# Patient Record
Sex: Female | Born: 1975 | Race: Black or African American | Hispanic: No | Marital: Single | Smoking: Never smoker
Health system: Southern US, Community
[De-identification: ages and names within clinical notes are randomized; demographics above are authoritative.]

## PROBLEM LIST (undated history)

## (undated) DIAGNOSIS — E119 Type 2 diabetes mellitus without complications: Secondary | ICD-10-CM

## (undated) DIAGNOSIS — M549 Dorsalgia, unspecified: Secondary | ICD-10-CM

## (undated) DIAGNOSIS — I10 Essential (primary) hypertension: Secondary | ICD-10-CM

## (undated) DIAGNOSIS — R29898 Other symptoms and signs involving the musculoskeletal system: Secondary | ICD-10-CM

## (undated) DIAGNOSIS — E669 Obesity, unspecified: Secondary | ICD-10-CM

## (undated) DIAGNOSIS — M79604 Pain in right leg: Secondary | ICD-10-CM

## (undated) HISTORY — DX: Dorsalgia, unspecified: M54.9

## (undated) HISTORY — DX: Pain in right leg: M79.604

## (undated) HISTORY — PX: FOOT SURGERY: SHX648

## (undated) HISTORY — DX: Type 2 diabetes mellitus without complications: E11.9

## (undated) HISTORY — DX: Essential (primary) hypertension: I10

## (undated) HISTORY — DX: Other symptoms and signs involving the musculoskeletal system: R29.898

## (undated) HISTORY — PX: CHOLECYSTECTOMY: SHX55

## (undated) HISTORY — DX: Obesity, unspecified: E66.9

---

## 2014-06-10 DIAGNOSIS — I10 Essential (primary) hypertension: Secondary | ICD-10-CM | POA: Insufficient documentation

## 2014-06-10 DIAGNOSIS — M533 Sacrococcygeal disorders, not elsewhere classified: Secondary | ICD-10-CM | POA: Insufficient documentation

## 2015-02-09 DIAGNOSIS — R208 Other disturbances of skin sensation: Secondary | ICD-10-CM | POA: Insufficient documentation

## 2015-02-09 DIAGNOSIS — M5431 Sciatica, right side: Secondary | ICD-10-CM | POA: Insufficient documentation

## 2015-02-09 DIAGNOSIS — M21372 Foot drop, left foot: Secondary | ICD-10-CM | POA: Insufficient documentation

## 2016-06-19 DIAGNOSIS — H52223 Regular astigmatism, bilateral: Secondary | ICD-10-CM | POA: Insufficient documentation

## 2016-06-19 DIAGNOSIS — E1165 Type 2 diabetes mellitus with hyperglycemia: Secondary | ICD-10-CM | POA: Insufficient documentation

## 2016-06-26 DIAGNOSIS — F320 Major depressive disorder, single episode, mild: Secondary | ICD-10-CM | POA: Insufficient documentation

## 2016-06-26 DIAGNOSIS — M21611 Bunion of right foot: Secondary | ICD-10-CM | POA: Insufficient documentation

## 2016-07-04 DIAGNOSIS — M2012 Hallux valgus (acquired), left foot: Secondary | ICD-10-CM | POA: Insufficient documentation

## 2016-07-04 DIAGNOSIS — M2140 Flat foot [pes planus] (acquired), unspecified foot: Secondary | ICD-10-CM | POA: Insufficient documentation

## 2018-03-22 DIAGNOSIS — Z794 Long term (current) use of insulin: Secondary | ICD-10-CM | POA: Insufficient documentation

## 2018-03-22 DIAGNOSIS — Z7984 Long term (current) use of oral hypoglycemic drugs: Secondary | ICD-10-CM | POA: Insufficient documentation

## 2018-03-22 DIAGNOSIS — H16223 Keratoconjunctivitis sicca, not specified as Sjogren's, bilateral: Secondary | ICD-10-CM | POA: Insufficient documentation

## 2018-11-29 ENCOUNTER — Other Ambulatory Visit: Payer: Self-pay | Admitting: Family Medicine

## 2018-11-29 DIAGNOSIS — R29898 Other symptoms and signs involving the musculoskeletal system: Secondary | ICD-10-CM

## 2018-12-15 ENCOUNTER — Other Ambulatory Visit: Payer: Self-pay

## 2018-12-15 ENCOUNTER — Ambulatory Visit
Admission: RE | Admit: 2018-12-15 | Discharge: 2018-12-15 | Disposition: A | Discharge status: Home / Self Care | Payer: 59 | Source: Ambulatory Visit | Attending: Family Medicine | Admitting: Family Medicine

## 2018-12-15 DIAGNOSIS — R29898 Other symptoms and signs involving the musculoskeletal system: Secondary | ICD-10-CM

## 2018-12-15 IMAGING — MR MR LUMBAR SPINE W/O CM
4 of 5 series · 18 of 48 positions shown · non-contrast
Comparison: Lumbar spine radiographs [DATE]

CLINICAL DATA: Fall 8 years ago. Left lower extremity weakness.
Pain and posterior right leg. Progressive symptoms since [REDACTED].
Initial encounter.

EXAM:
MRI LUMBAR SPINE WITHOUT CONTRAST
TECHNIQUE: Multiplanar, multisequence MR imaging of the lumbar spine was
performed. No intravenous contrast was administered.

[Series 5: T2 · sagittal · 4.0mm · 0.73mm/px · 6 of 15 slices shown (1 of 2)]
[im 1/15]
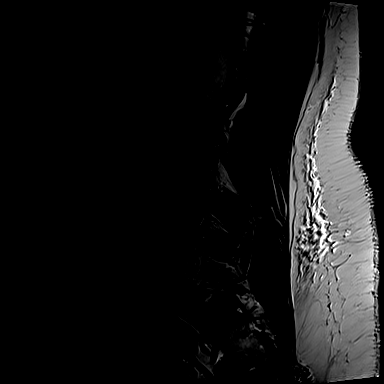
[im 3/15]
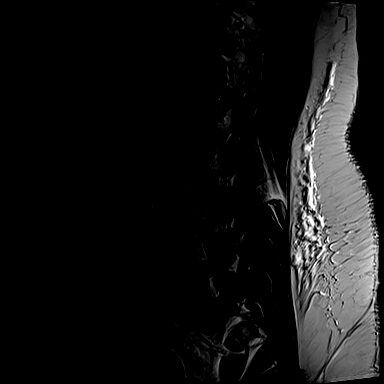
[im 6/15]
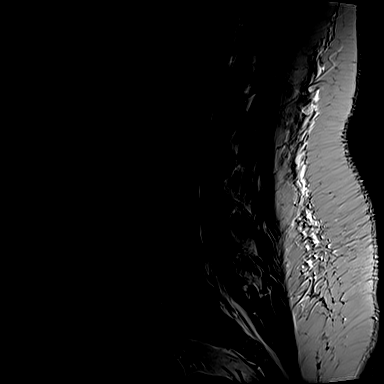
[im 9/15]
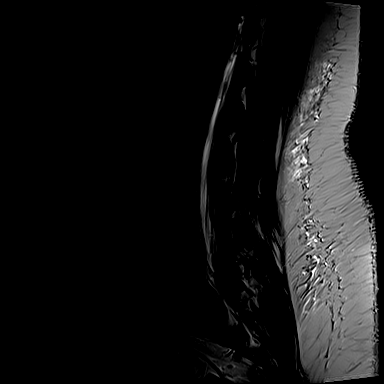
[im 12/15]
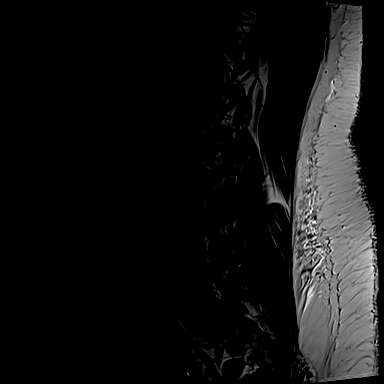
[im 15/15]
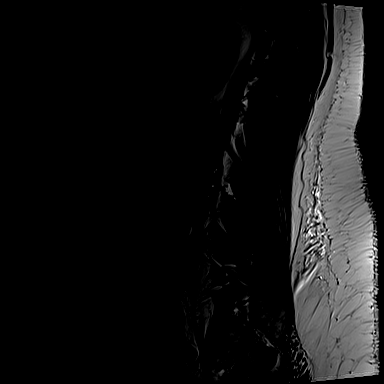

[Series 6: T1 · sagittal · 4.0mm · 0.88mm/px · 3 of 15 slices shown (1 of 2)]
[im 1/15]
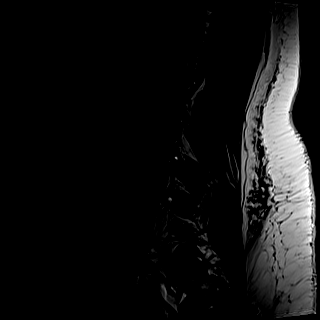
[im 8/15]
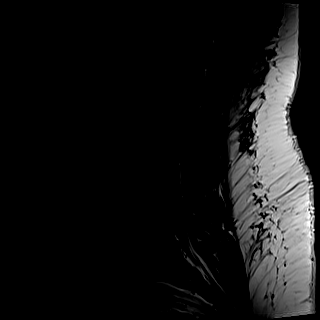
[im 15/15]
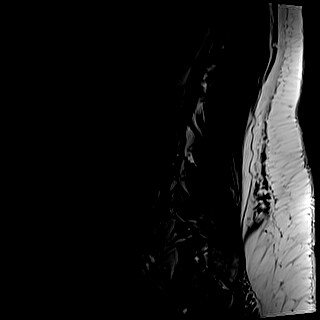

[Series 12: T2 · axial · 4.0mm · 0.28mm/px · z∈[-63,+134]mm · 6 of 43 slices shown (2 of 2)]
[im 3/43]
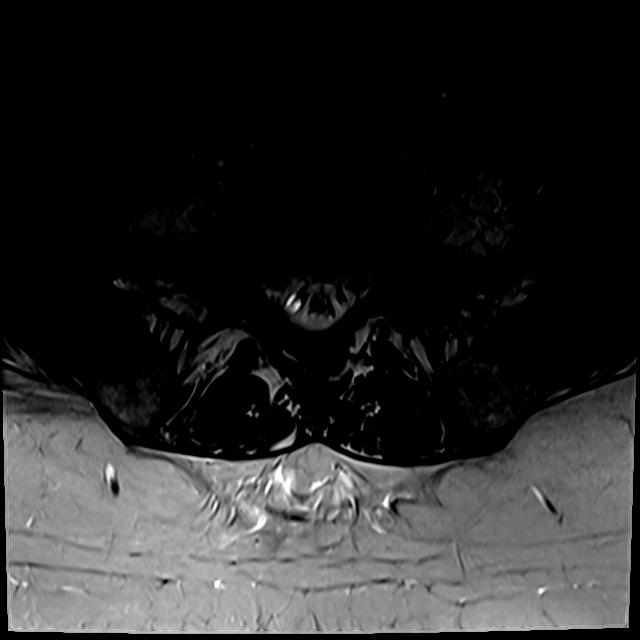
[im 6/43]
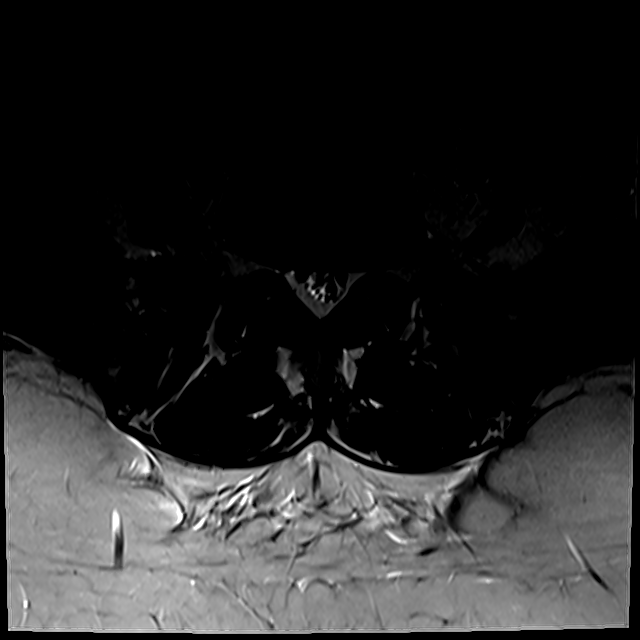
[im 9/43]
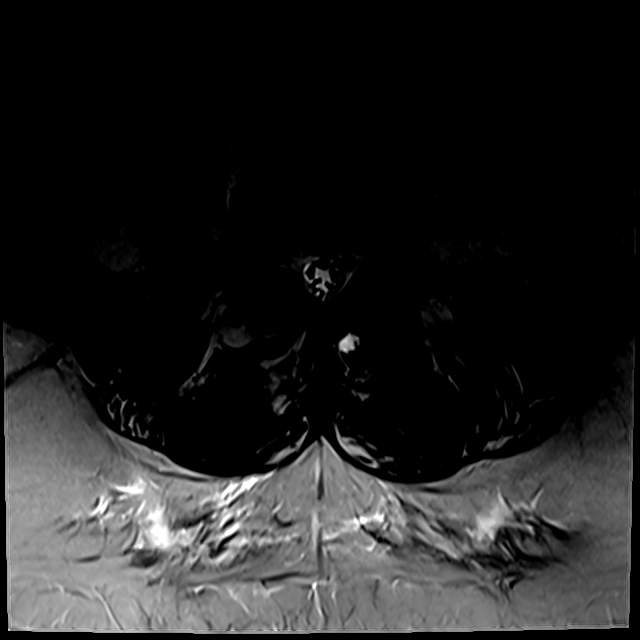
[im 15/43]
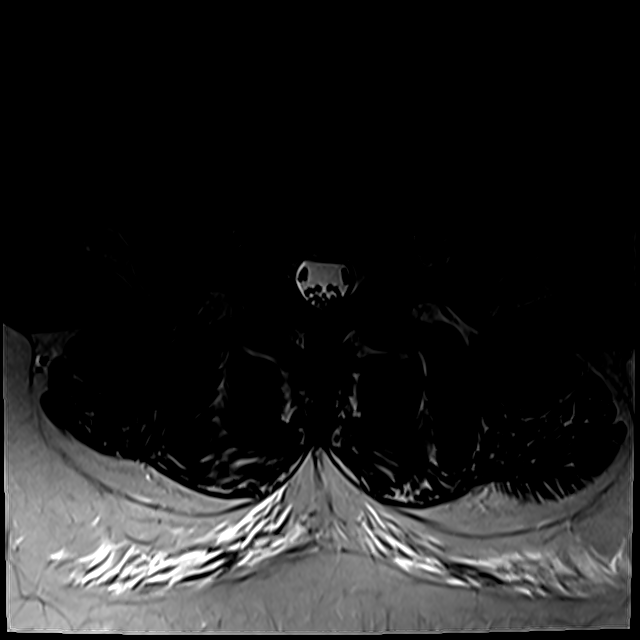
[im 23/43]
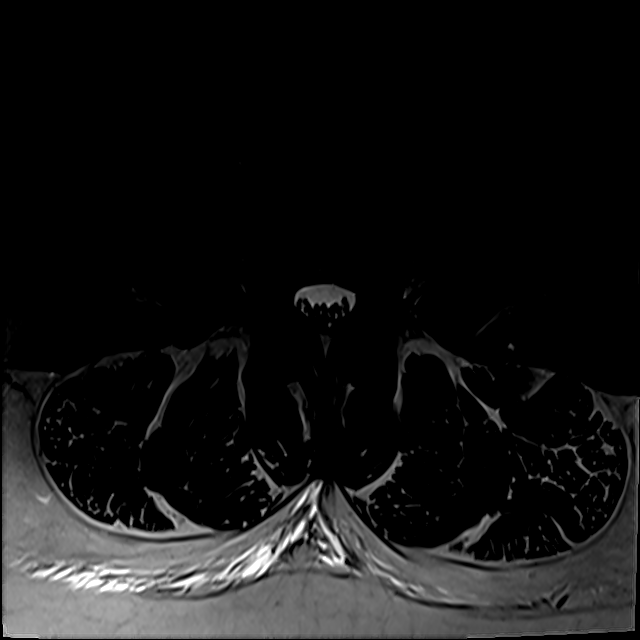
[im 37/43]
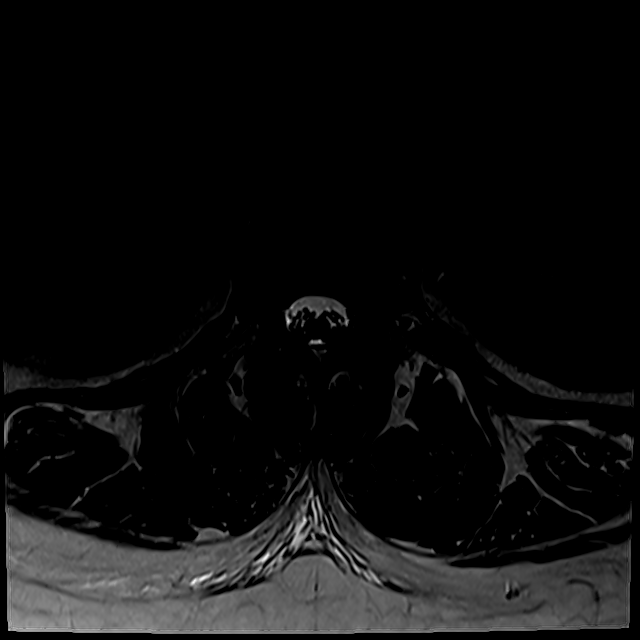

[Series 100: T1 · axial · 4.0mm · 0.28mm/px · z∈[-49,+134]mm · 3 of 43 slices shown (2 of 2)]
[im 6/43]
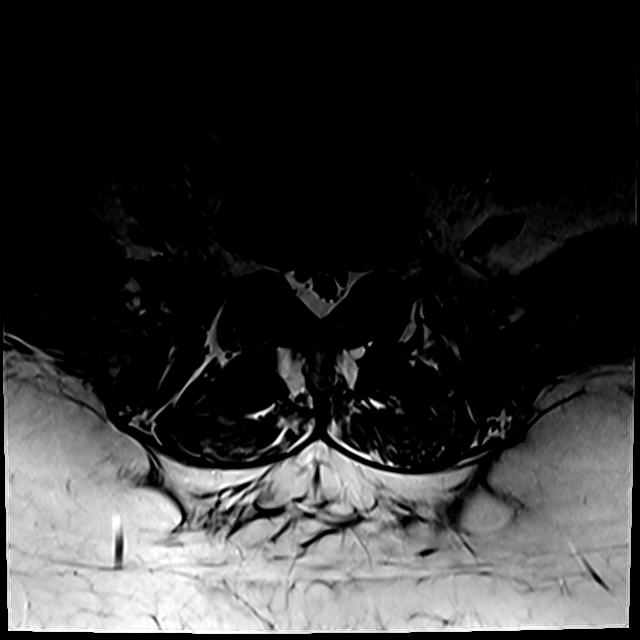
[im 23/43]
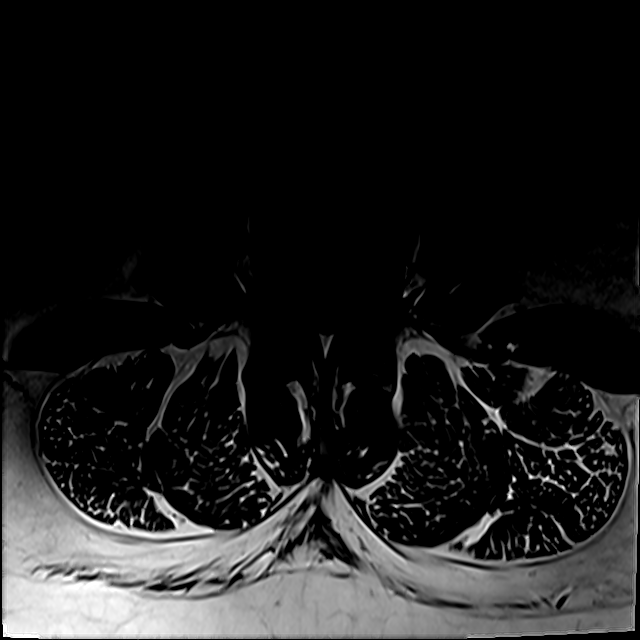
[im 37/43]
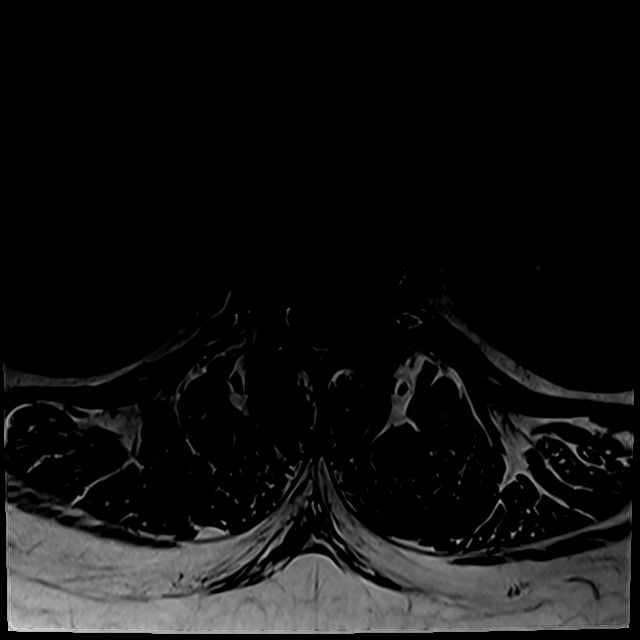

[18 of 48 positions shown; findings below may reference images not displayed]

FINDINGS: Segmentation: 5 non rib-bearing lumbar type vertebral bodies are
present. The lowest fully formed vertebral body is L5.

Alignment:  Slight leftward curvature is centered at L4

Vertebrae:  Marrow signal and vertebral body heights are normal.

Conus medullaris and cauda equina: Conus extends to the L1 level.
Conus and cauda equina appear normal.

Paraspinal and other soft tissues: Limited imaging the abdomen is
unremarkable. There is no significant adenopathy. No solid organ
lesions are present.

Disc levels:

L1-2: Negative.

L2-3: Negative.

L3-4: Mild facet hypertrophy is present. There is some disc bulging.
No significant stenosis is present.

L4-5: Moderate facet hypertrophy is present. There are prominent
effusions in the joints bilaterally. A mild broad-based disc bulge
is present. No focal stenosis is present.

L5-S1: A broad-based disc protrusion is present. Far right annular
tear is present into the foramen. Mild facet hypertrophy is noted
bilaterally. No significant focal stenosis is present. Prominent
epidural fat is present at the L5 level and below.
IMPRESSION: 1. Moderate facet hypertrophy at L4-5 with prominent effusions in
the joints bilaterally. There may be listhesis with movement at this
level. Flexion and extension radiographs would be useful for further
evaluation.
2. Mild disc bulging and facet hypertrophy at L3-4 without
significant stenosis.
3. Far right annular tear at L5-S1 without significant stenosis.
4. Epidural lipomatosis.

## 2018-12-31 DIAGNOSIS — M47816 Spondylosis without myelopathy or radiculopathy, lumbar region: Secondary | ICD-10-CM | POA: Insufficient documentation

## 2019-02-05 ENCOUNTER — Other Ambulatory Visit: Payer: Self-pay | Admitting: Neurosurgery

## 2019-02-05 DIAGNOSIS — G35 Multiple sclerosis: Secondary | ICD-10-CM

## 2019-02-10 DIAGNOSIS — M5126 Other intervertebral disc displacement, lumbar region: Secondary | ICD-10-CM | POA: Insufficient documentation

## 2019-02-10 DIAGNOSIS — M25559 Pain in unspecified hip: Secondary | ICD-10-CM | POA: Insufficient documentation

## 2019-03-13 ENCOUNTER — Ambulatory Visit
Admission: RE | Admit: 2019-03-13 | Discharge: 2019-03-13 | Disposition: A | Discharge status: Home / Self Care | Payer: No Typology Code available for payment source | Source: Ambulatory Visit | Attending: Neurosurgery | Admitting: Neurosurgery

## 2019-03-13 ENCOUNTER — Ambulatory Visit
Admission: RE | Admit: 2019-03-13 | Discharge: 2019-03-13 | Disposition: A | Discharge status: Home / Self Care | Payer: 59 | Source: Ambulatory Visit | Attending: Neurosurgery | Admitting: Neurosurgery

## 2019-03-13 ENCOUNTER — Other Ambulatory Visit: Payer: Self-pay

## 2019-03-13 DIAGNOSIS — G35 Multiple sclerosis: Secondary | ICD-10-CM

## 2019-03-13 IMAGING — MR MR HEAD W/O CM
9 series · 45 of 48 positions shown · non-contrast
Comparison: No pertinent prior studies available for comparison.

CLINICAL DATA: Multiple sclerosis. Additional history provided by
scanning technologist: Patient experiencing left leg symptoms and
foot drop for 6 years

EXAM:
MRI HEAD WITHOUT CONTRAST
TECHNIQUE: Multiplanar, multiecho pulse sequences of the brain and surrounding
structures were obtained without intravenous contrast.

[Series 2: ep2d_diff_3 · axial · 3.0mm · 1.88mm/px · z∈[-49,+104]mm · 10 of 100 slices shown]
[im 1/100]
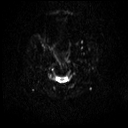
[im 12/100]
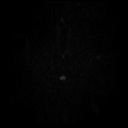
[im 23/100]
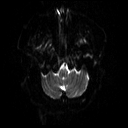
[im 34/100]
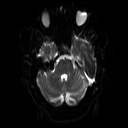
[im 45/100]
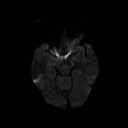
[im 56/100]
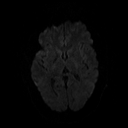
[im 67/100]
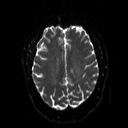
[im 78/100]
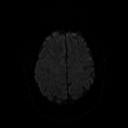
[im 89/100]
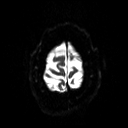
[im 100/100]
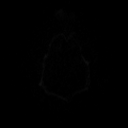

[Series 3: ep2d_diff_3_adc · axial · 3.0mm · 1.88mm/px · z∈[-49,+104]mm · 5 of 51 slices shown]
[im 1/51]
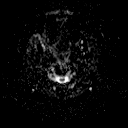
[im 13/51]
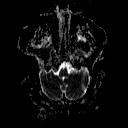
[im 26/51]
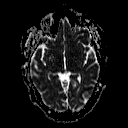
[im 38/51]
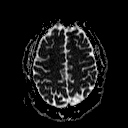
[im 51/51]
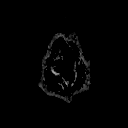

[Series 4: t2_tse_tra · axial · 5.0mm · 0.60mm/px · z∈[-42,+96]mm · 3 of 24 slices shown]
[im 1/24]
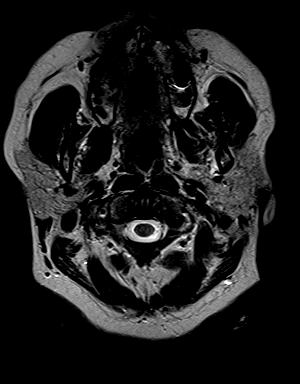
[im 12/24]
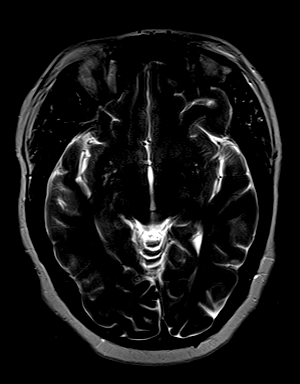
[im 24/24]
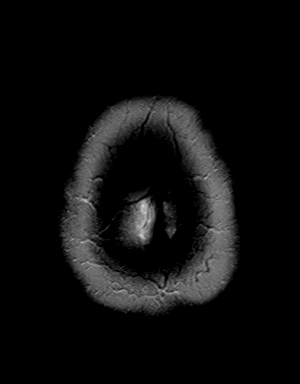

[Series 5: FLAIR · axial · 3.0mm · 0.43mm/px · z∈[-51,+105]mm · 3 of 27 slices shown (1 of 2)]
[im 1/27]
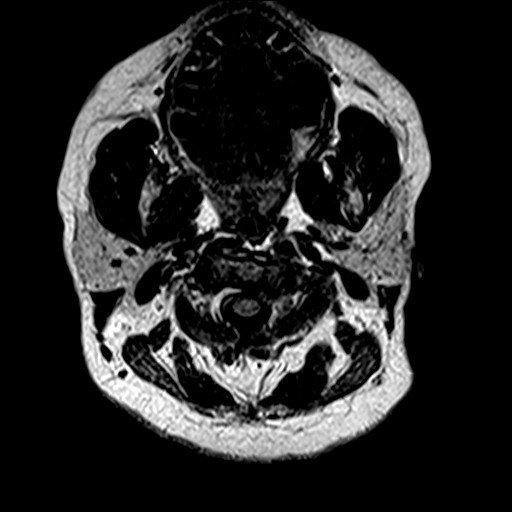
[im 14/27]
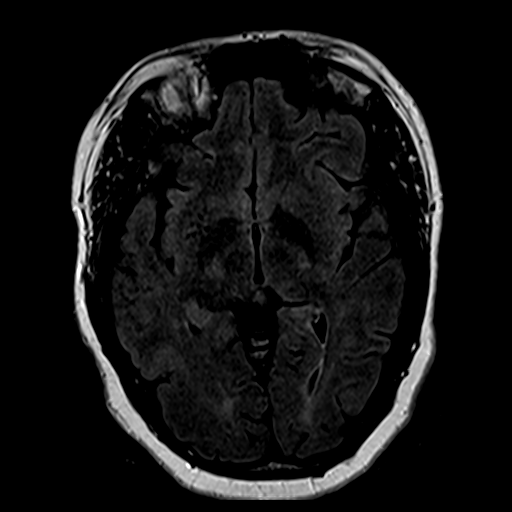
[im 27/27]
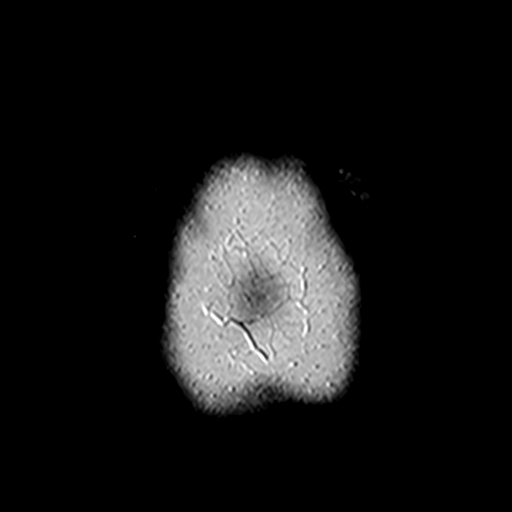

[Series 6: t1_mpr_tra · axial · 1.0mm · 0.72mm/px · z∈[-45,+98]mm · 12 of 144 slices shown]
[im 1/144]
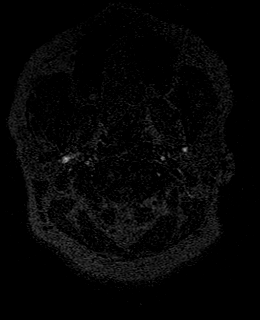
[im 11/144]
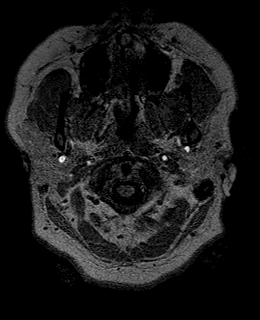
[im 21/144]
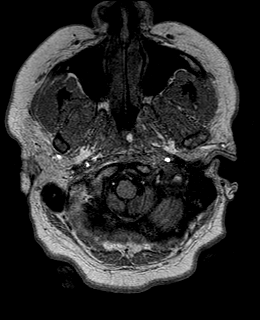
[im 31/144]
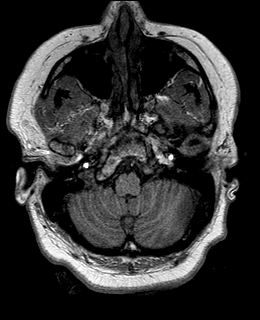
[im 41/144]
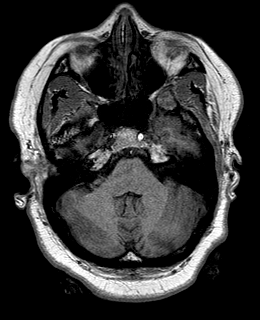
[im 52/144]
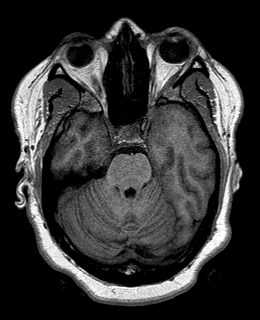
[im 62/144]
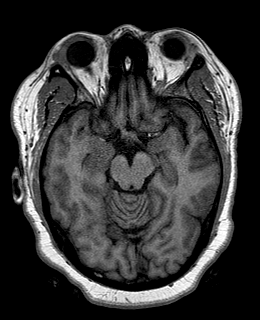
[im 72/144]
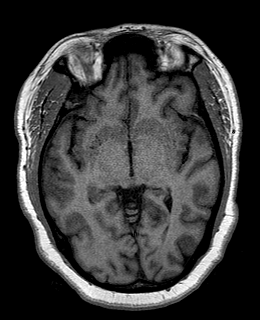
[im 82/144]
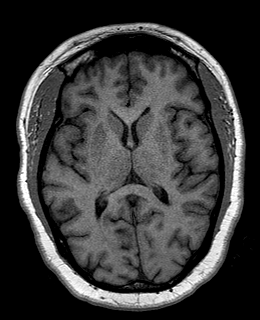
[im 103/144]
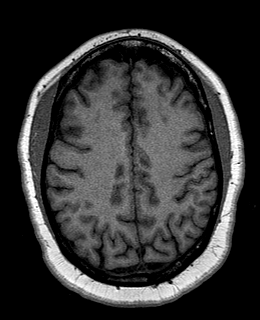
[im 123/144]
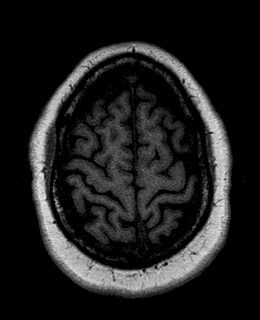
[im 144/144]
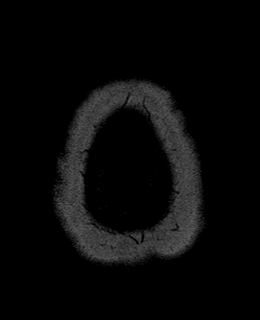

[Series 7: t1_se_sag · sagittal · 5.0mm · 0.45mm/px · 2 of 23 slices shown]
[im 1/23]
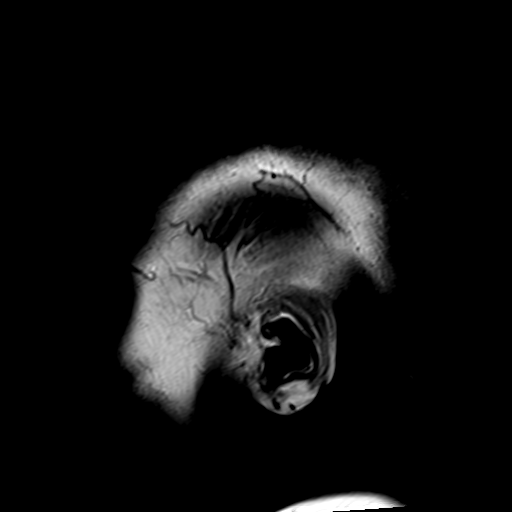
[im 23/23]
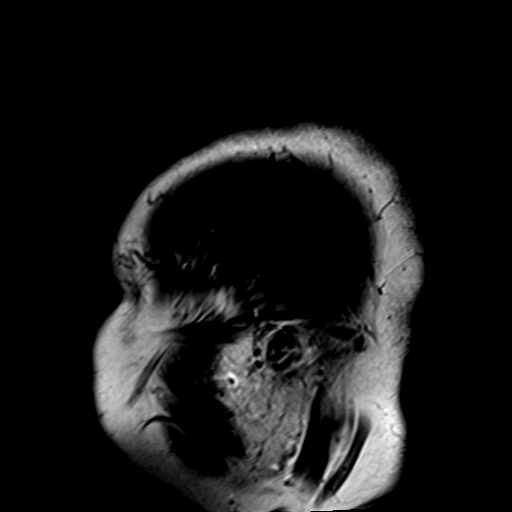

[Series 9: swi_images · axial · 4.0mm · 0.90mm/px · z∈[-43,+97]mm · 4 of 36 slices shown]
[im 1/36]
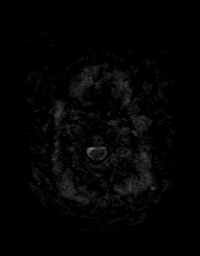
[im 12/36]
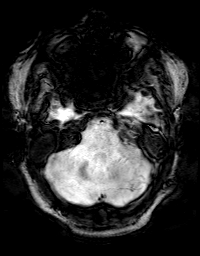
[im 24/36]
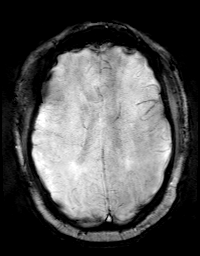
[im 36/36]
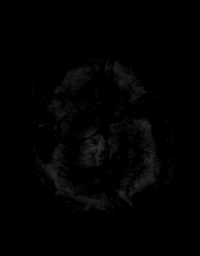

[Series 11: FLAIR · sagittal · 5.0mm · 0.45mm/px · 3 of 25 slices shown (2 of 2)]
[im 1/25]
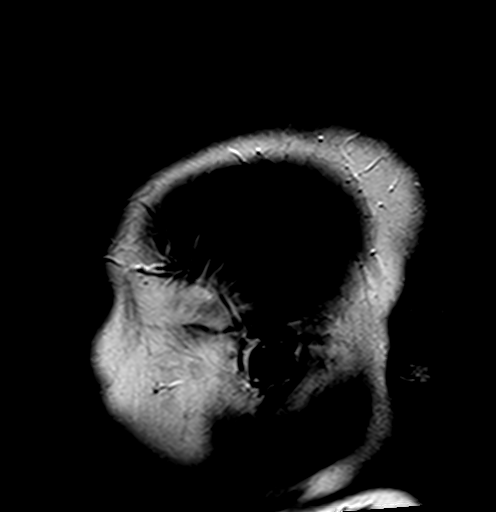
[im 13/25]
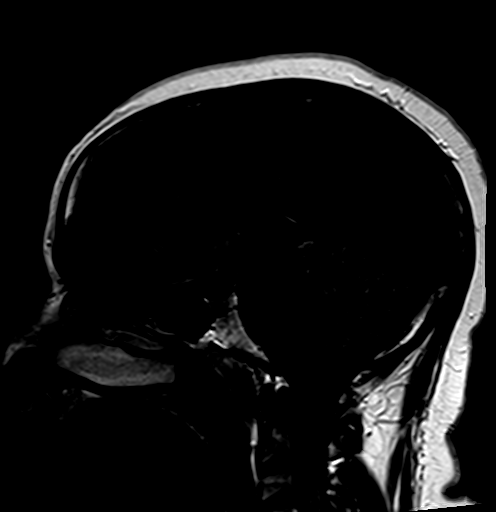
[im 25/25]
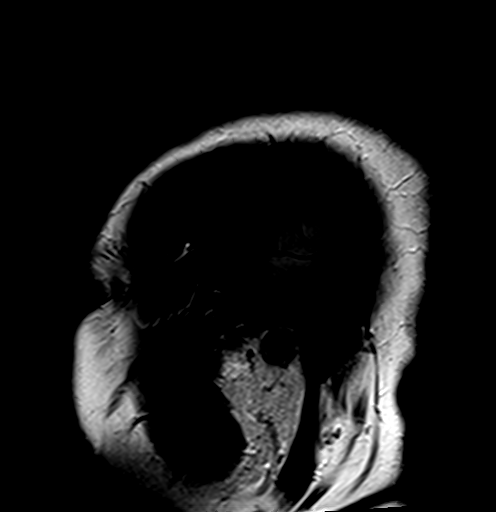

[Series 12: T2 · coronal · 5.0mm · 0.45mm/px · 3 of 29 slices shown]
[im 1/29]
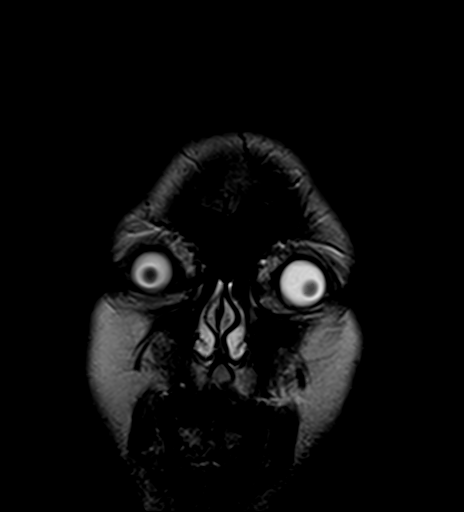
[im 15/29]
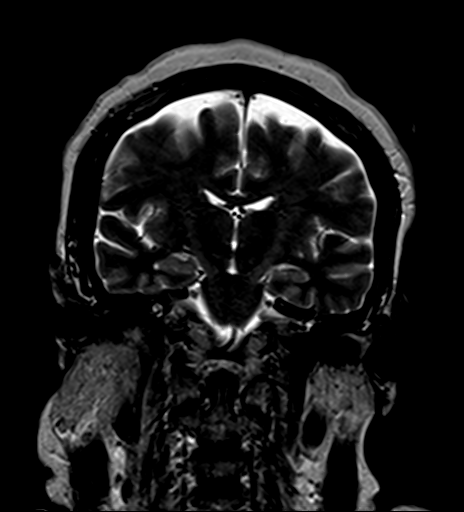
[im 29/29]
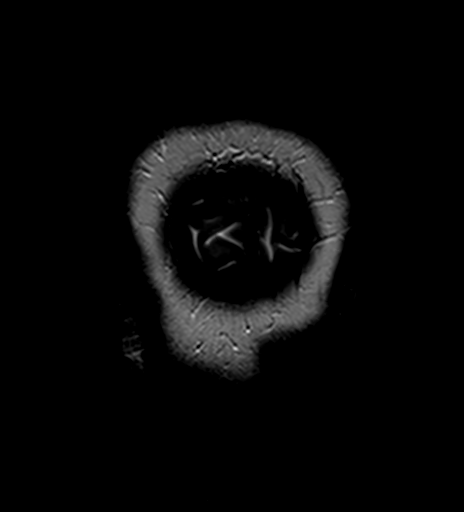

[45 of 48 positions shown; findings below may reference images not displayed]

FINDINGS: Brain:

There are multiple (numbering approximately 10-15) small foci of
T2/FLAIR hyperintensity within the deep periventricular white
matter. These foci measure up to 8 mm. Several of these foci
demonstrate an orientation which is oriented perpendicular to the
lateral ventricles (for instance as seen on series 11, image 17).

No evidence of acute infarct. No evidence of intracranial mass. No
midline shift or extra-axial fluid collection. No chronic
intracranial blood products. Cerebral volume is normal for age.

Vascular: Flow voids maintained within the proximal large arterial
vessels.

Skull and upper cervical spine: No focal marrow lesion.

Sinuses/Orbits: Visualized orbits demonstrate no acute abnormality.
No significant paranasal sinus disease or mastoid effusion at the
imaged levels.
IMPRESSION: Multiple T2 hyperintense foci within the deep periventricular white
matter measuring up to 8 mm (numbering approximately 10-15). These
lesions are nonspecific but do demonstrate some features of multiple
sclerosis (orientation perpendicular to the lateral ventricles).
Alternative differential considerations include chronic small vessel
ischemic disease, sequela of prior infectious/inflammatory process,
vasculitis and migraine headaches, among others.

Otherwise normal MRI appearance of the brain for age.

## 2019-03-13 IMAGING — MR MR CERVICAL SPINE W/O CM
4 of 5 series · 28 of 48 positions shown · non-contrast
Comparison: Brain study same day

CLINICAL DATA: Left foot and leg weakness over the last 6 years.
Question demyelinating disease.

EXAM:
MRI CERVICAL SPINE WITHOUT CONTRAST
TECHNIQUE: Multiplanar, multisequence MR imaging of the cervical spine was
performed. No intravenous contrast was administered.

[Series 2: T2 · sagittal · 3.0mm · 0.66mm/px · 6 of 15 slices shown (1 of 2)]
[im 1/15]
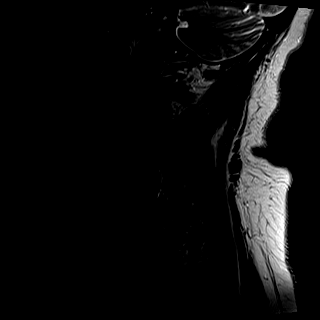
[im 3/15]
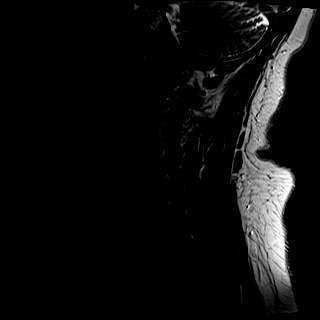
[im 6/15]
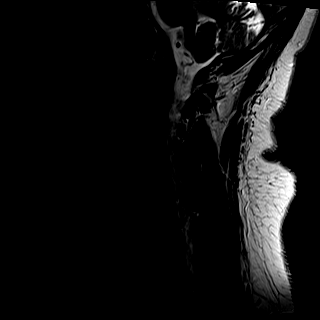
[im 9/15]
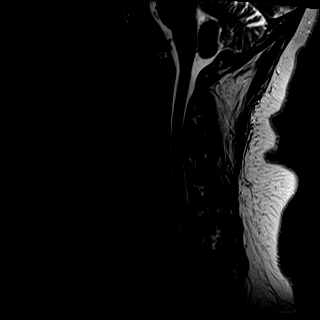
[im 12/15]
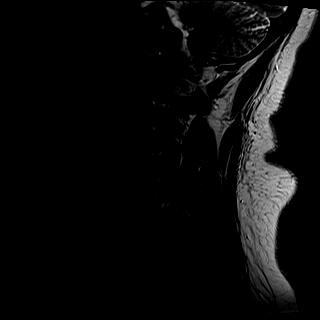
[im 15/15]
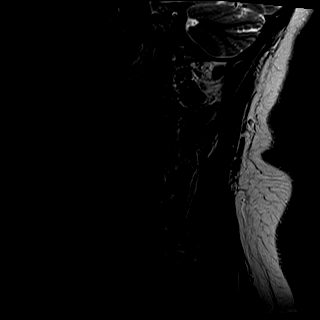

[Series 3: STIR · sagittal · 3.0mm · 0.41mm/px · 7 of 15 slices shown]
[im 1/15]
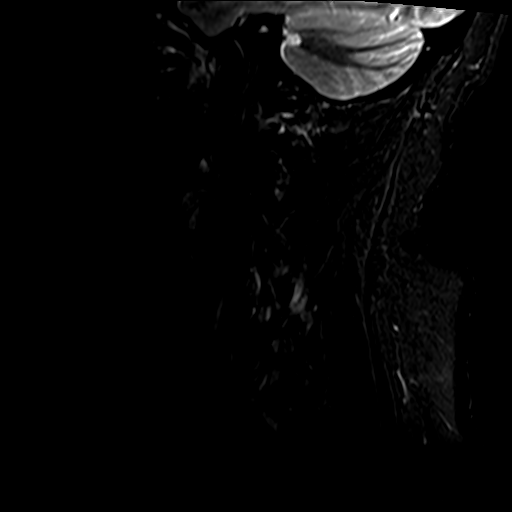
[im 3/15]
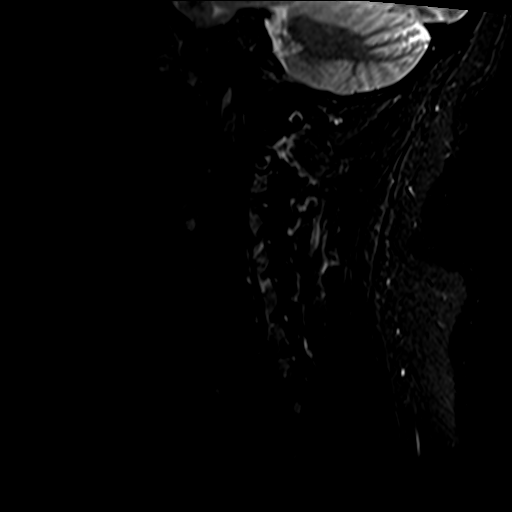
[im 5/15]
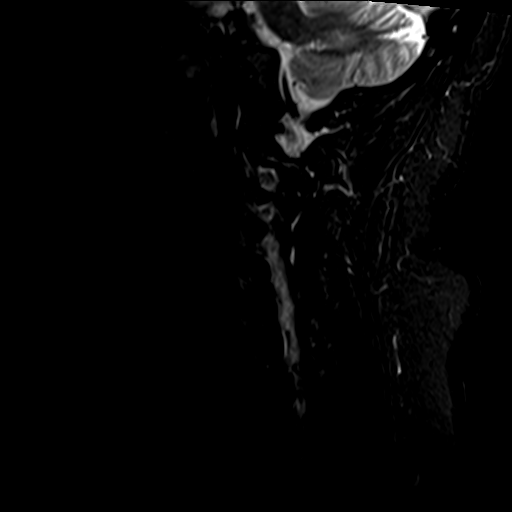
[im 8/15]
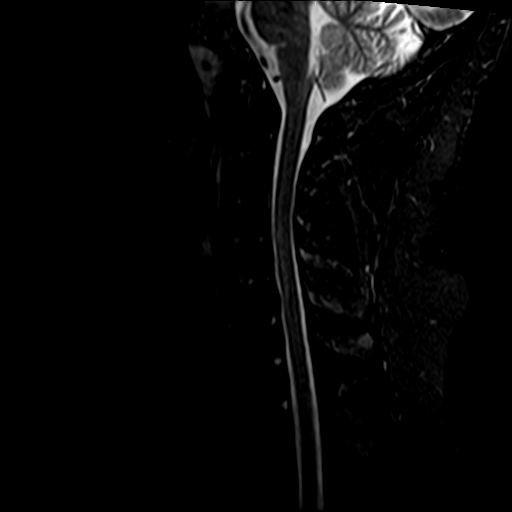
[im 10/15]
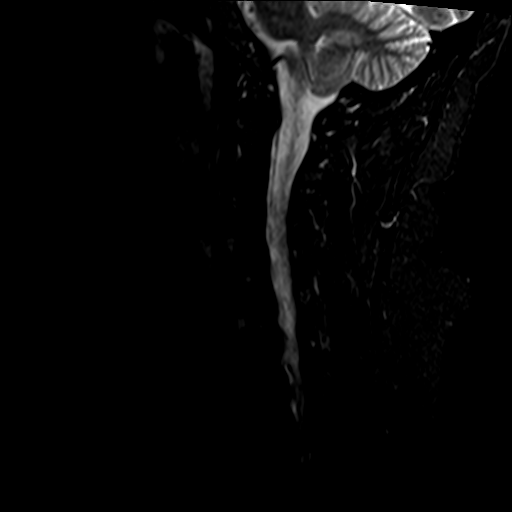
[im 12/15]
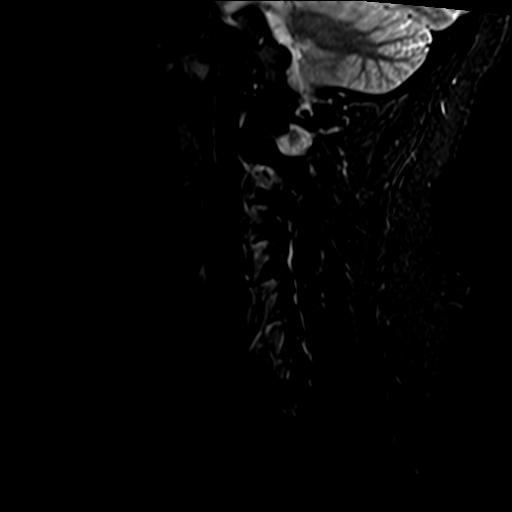
[im 15/15]
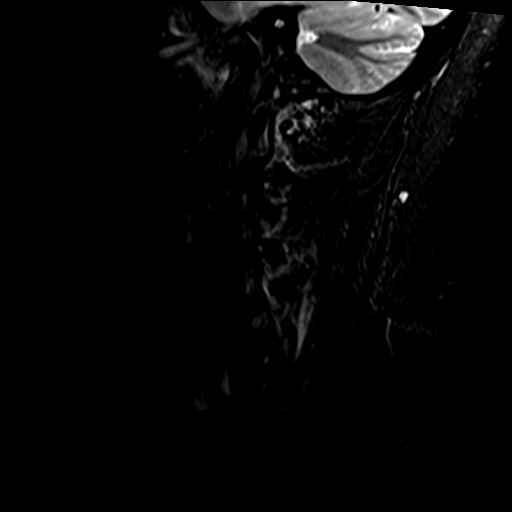

[Series 4: T1 · sagittal · 3.0mm · 0.41mm/px · 7 of 15 slices shown]
[im 1/15]
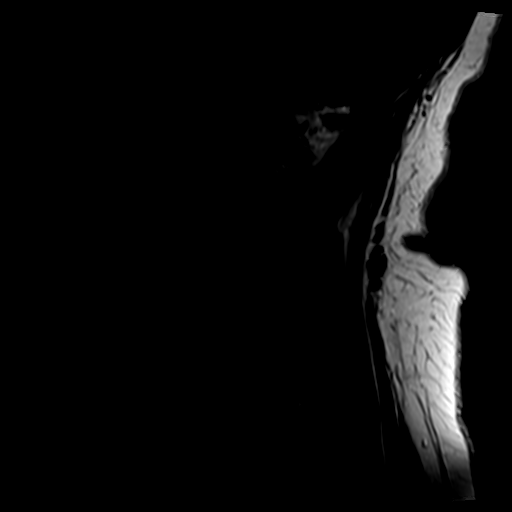
[im 3/15]
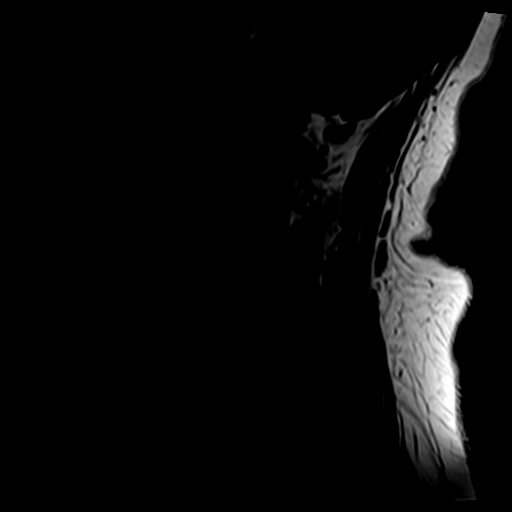
[im 5/15]
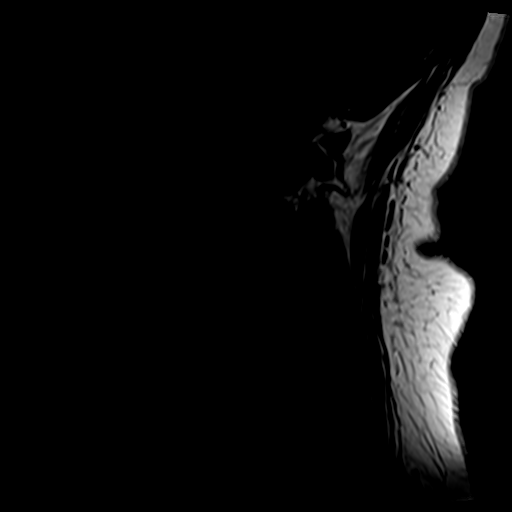
[im 8/15]
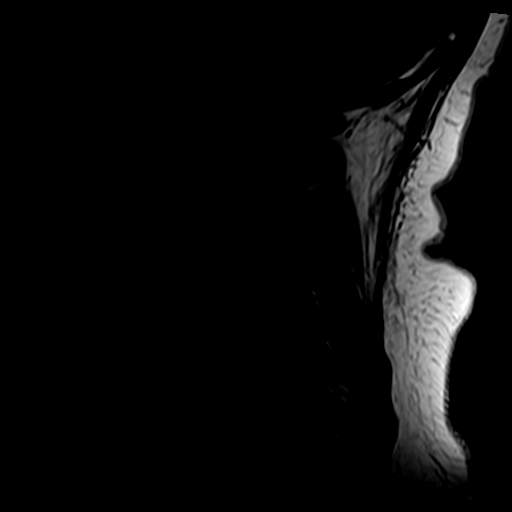
[im 10/15]
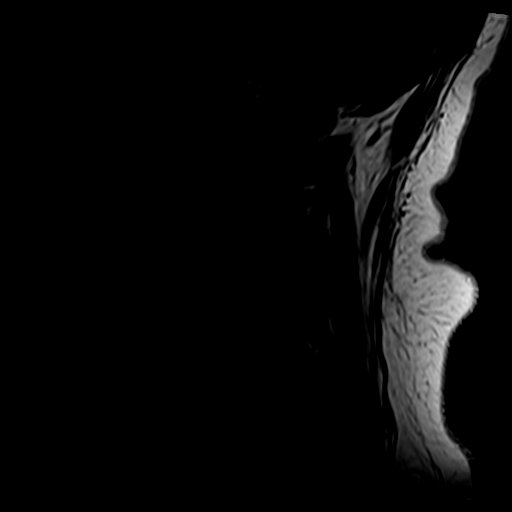
[im 12/15]
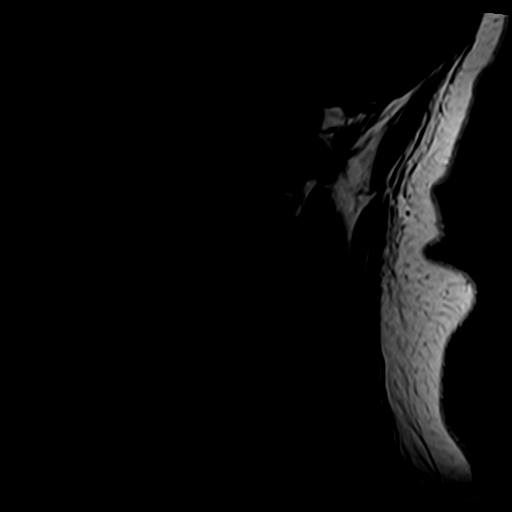
[im 15/15]
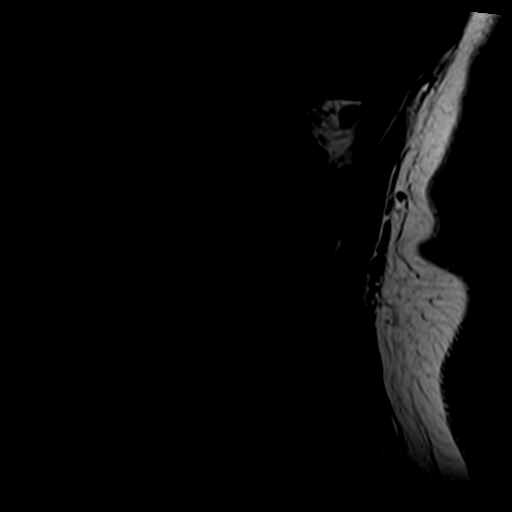

[Series 6: T2 · axial · 3.0mm · 0.78mm/px · z∈[-106,+4]mm · 8 of 31 slices shown (2 of 2)]
[im 1/31]
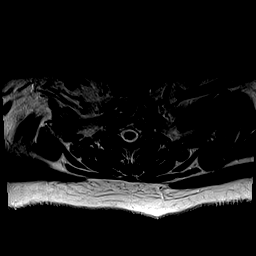
[im 5/31]
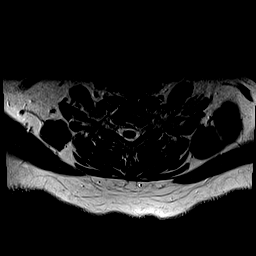
[im 10/31]
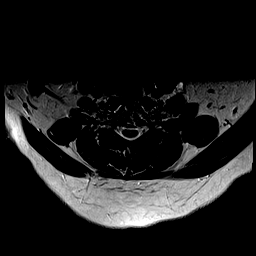
[im 14/31]
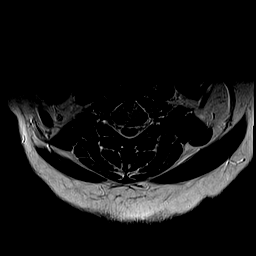
[im 17/31]
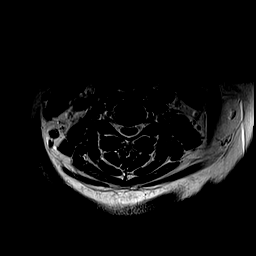
[im 21/31]
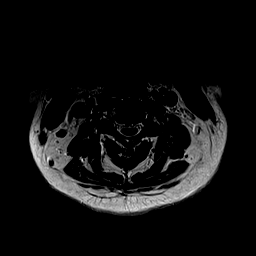
[im 26/31]
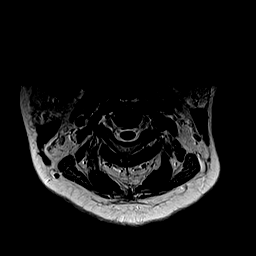
[im 31/31]
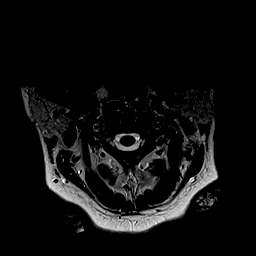

[28 of 48 positions shown; findings below may reference images not displayed]

FINDINGS: Alignment: Normal

Vertebrae: Normal

Cord: Allowing for mild CSF pulse a shin artifact, the cord has a
normal appearance. No evidence of demyelinating lesion in the
cervical or upper thoracic cord.

Posterior Fossa, vertebral arteries, paraspinal tissues: Negative

Disc levels:

No significant degenerative change. Minimal, non-compressive disc
bulges at C5-6 and C6-7. Canal and foramina widely patent.
IMPRESSION: No evidence of demyelinating disease of the cervical or upper
thoracic cord.

No significant degenerative change. No stenosis or neural
compression.

## 2019-04-23 ENCOUNTER — Other Ambulatory Visit: Payer: Self-pay

## 2019-04-23 ENCOUNTER — Encounter: Payer: Self-pay | Admitting: *Deleted

## 2019-04-23 ENCOUNTER — Encounter: Payer: Self-pay | Admitting: Neurology

## 2019-04-23 ENCOUNTER — Ambulatory Visit (INDEPENDENT_AMBULATORY_CARE_PROVIDER_SITE_OTHER): Payer: 59 | Admitting: Neurology

## 2019-04-23 VITALS — BP 139/81 | HR 117 | Temp 97.6°F | Ht 68.0 in | Wt 292.0 lb

## 2019-04-23 DIAGNOSIS — R29898 Other symptoms and signs involving the musculoskeletal system: Secondary | ICD-10-CM

## 2019-04-23 DIAGNOSIS — R933 Abnormal findings on diagnostic imaging of other parts of digestive tract: Secondary | ICD-10-CM | POA: Diagnosis not present

## 2019-04-23 DIAGNOSIS — R9089 Other abnormal findings on diagnostic imaging of central nervous system: Secondary | ICD-10-CM

## 2019-04-23 DIAGNOSIS — R269 Unspecified abnormalities of gait and mobility: Secondary | ICD-10-CM | POA: Insufficient documentation

## 2019-04-23 NOTE — Progress Notes (Signed)
PATIENT: Jessica Hernandez DOB: 23-Apr-1975  Chief Complaint  Patient presents with  . r/o Multiple Sclerosis    She is here today for new care. Reports LLE weakness and RLE pain. She recently had MRI scans of brain, cervical, lumbar. Recent NCV/EMG.   . Neurosurgery    Coletta Memos, MD - referring provider  . PCP    Herma Carson, MD     HISTORICAL  Jessica Hernandez is a 44 year old female, seen in request by neurosurgeon Dr. Franky Macho, and primary care physician Dr. Lindajo Royal for evaluation of gait abnormality, new onset paresthesia, and abnormal MRI scans, initial evaluation was on April 23, 2019.  I have reviewed and summarized the referring note from the referring physician.  She had a past medical history of insulin-dependent diabetes, obesity, hypertension,  Around 2015, she noticed gradual onset left leg difficulty, she has to use her hands to lift up her left leg when step into the car, rely on the handrail to go up stairs, she denies significant pain, or sensory loss, she was seen by outside neurologist, had MRI of lumbar spine with no clear etiology was found, her left leg weakness has been persistent over the years  January 2020, she began to develop low back pain, radiating pain to right lower extremity, along the right lateral thigh, anterior shin, bottom of her right foot, also complains of slow worsening urinary urgency,  Since September 2020, she noticed left hand weakness, she has difficulty opening bottles with her left hand, denies pain, or sensory loss  She has been followed up by her optometrist on a yearly basis, there was no visual loss  Since January 2021, she noticed numbness tingling heating sensation at the anterior abdomen region, radiating aiding along left lower thoracic region,  I personally reviewed MRIs in January 2021,  MRI of brain, multiple T2/flair hyperintensity within the deep periventricular white multiple T2/flair hyperintensity lesion within the  deep periventricular white matter, differentiation diagnosis including multiple sclerosis versus small vessel disease  MRI of the cervical spine no intrinsic cord lesion, no significant degenerative disease  MRI of lumbar, moderate facet hypertrophy at L4-5, with prominent infusion in the joints bilaterally, no significant canal or foraminal narrowing  Laboratory evaluation November 2020, CMP elevated glucose 141, creatinine 0.67, Negative ANA,  REVIEW OF SYSTEMS: Full 14 system review of systems performed and notable only for as above All other review of systems were negative.  ALLERGIES: Allergies  Allergen Reactions  . Clarithromycin Other (See Comments)    Other reaction(s): trouble swallowing    HOME MEDICATIONS: Current Outpatient Medications  Medication Sig Dispense Refill  . acetaminophen (TYLENOL) 500 MG tablet Take 2 tablets by mouth daily as needed.     . ASPIRIN 81 PO Take 1 tablet by mouth daily.    . cyclobenzaprine (FLEXERIL) 10 MG tablet Take 10 mg by mouth daily as needed for muscle spasms.    . diphenhydrAMINE (BENADRYL) 50 MG tablet Take 50 mg by mouth at bedtime as needed for itching.    . DULoxetine (CYMBALTA) 60 MG capsule Take 60 mg by mouth daily.    Marland Kitchen gabapentin (NEURONTIN) 300 MG capsule Take 300 mg by mouth 3 (three) times daily.    . insulin detemir (LEVEMIR) 100 UNIT/ML injection Inject 20 Units into the skin at bedtime.     Marland Kitchen JANUVIA 100 MG tablet Take 100 mg by mouth daily.    Marland Kitchen lisinopril-hydrochlorothiazide (ZESTORETIC) 10-12.5 MG tablet Take 1 tablet by mouth daily.    Marland Kitchen  metFORMIN (GLUCOPHAGE) 1000 MG tablet Take 1,000 mg by mouth 2 (two) times daily with a meal.      No current facility-administered medications for this visit.    PAST MEDICAL HISTORY: Past Medical History:  Diagnosis Date  . Back pain   . Diabetes (HCC)   . Hypertension   . Left leg weakness   . Obesity   . Right leg pain     PAST SURGICAL HISTORY: Past Surgical  History:  Procedure Laterality Date  . CHOLECYSTECTOMY    . FOOT SURGERY     age 32    FAMILY HISTORY: Family History  Problem Relation Age of Onset  . Multiple sclerosis Sister   . Diabetes Mother   . Thyroid cancer Mother   . Hypertension Mother   . Rheum arthritis Mother   . Hypertension Father   . Prostate cancer Father   . Transient ischemic attack Father   . Healthy Brother   . Diabetes Maternal Grandmother   . Other Maternal Grandfather        unsure of medical history  . Other Paternal Grandmother        unsure of medical history  . Other Paternal Grandfather        unsure of medical history  . Healthy Sister     SOCIAL HISTORY: Social History   Socioeconomic History  . Marital status: Single    Spouse name: Not on file  . Number of children: 1  . Years of education: college  . Highest education level: Not on file  Occupational History  . Occupation: Charity fundraiser - Civil engineer, contracting of diseases  Tobacco Use  . Smoking status: Never Smoker  . Smokeless tobacco: Never Used  Substance and Sexual Activity  . Alcohol use: Yes    Comment: occasional wine  . Drug use: Never  . Sexual activity: Not on file  Other Topics Concern  . Not on file  Social History Narrative   Lives at home with her daughter.   Right-handed.   Caffeine use: 2 cups every other day.   Social Determinants of Health   Financial Resource Strain:   . Difficulty of Paying Living Expenses: Not on file  Food Insecurity:   . Worried About Programme researcher, broadcasting/film/video in the Last Year: Not on file  . Ran Out of Food in the Last Year: Not on file  Transportation Needs:   . Lack of Transportation (Medical): Not on file  . Lack of Transportation (Non-Medical): Not on file  Physical Activity:   . Days of Exercise per Week: Not on file  . Minutes of Exercise per Session: Not on file  Stress:   . Feeling of Stress : Not on file  Social Connections:   . Frequency of Communication with Friends and Family:  Not on file  . Frequency of Social Gatherings with Friends and Family: Not on file  . Attends Religious Services: Not on file  . Active Member of Clubs or Organizations: Not on file  . Attends Banker Meetings: Not on file  . Marital Status: Not on file  Intimate Partner Violence:   . Fear of Current or Ex-Partner: Not on file  . Emotionally Abused: Not on file  . Physically Abused: Not on file  . Sexually Abused: Not on file     PHYSICAL EXAM   Vitals:   04/23/19 1544  BP: 139/81  Pulse: (!) 117  Temp: 97.6 F (36.4 C)  Weight: 292 lb (132.5  kg)  Height: 5\' 8"  (1.727 m)    Not recorded      Body mass index is 44.4 kg/m.  PHYSICAL EXAMNIATION:  Gen: NAD, conversant, well nourised, well groomed                     Cardiovascular: Regular rate rhythm, no peripheral edema, warm, nontender. Eyes: Conjunctivae clear without exudates or hemorrhage Neck: Supple, no carotid bruits. Pulmonary: Clear to auscultation bilaterally   NEUROLOGICAL EXAM:  MENTAL STATUS: Speech:    Speech is normal; fluent and spontaneous with normal comprehension.  Cognition:     Orientation to time, place and person     Normal recent and remote memory     Normal Attention span and concentration     Normal Language, naming, repeating,spontaneous speech     Fund of knowledge   CRANIAL NERVES: CN II: Visual fields are full to confrontation. Pupils are round equal and briskly reactive to light. CN III, IV, VI: extraocular movement are normal. No ptosis. CN V: Facial sensation is intact to light touch CN VII: Face is symmetric with normal eye closure  CN VIII: Hearing is normal to causal conversation. CN IX, X: Phonation is normal. CN XI: Head turning and shoulder shrug are intact  MOTOR: Mild fixation of left upper extremity upon rapid rotating movement, mild spasticity of left lower extremity, left hip flexion 3, knee extension 4, knee flexion 5, ankle dorsiflexion 4, ankle  plantarflexion 4  REFLEXES: Upper extremity reflexes were symmetric, left knee reflexes were 3, right was 2, left side Babinski sign,  SENSORY: Intact to light touch, pinprick and vibratory sensation are intact in fingers and toes.  COORDINATION: There is no trunk or limb dysmetria noted.  GAIT/STANCE: She can get up from seated position arms crossed, dragging left leg across the floor, could not perform left heel walking or tiptoe walking Romberg is absent.   DIAGNOSTIC DATA (LABS, IMAGING, TESTING) - I reviewed patient records, labs, notes, testing and imaging myself where available.   ASSESSMENT AND PLAN  Jessica Hernandez is a 44 y.o. female   44 year old female, with left leg weakness since 2015,  New onset paresthesia involving the lower thoracic myotomes,  On examination, she has moderate left lower extremity proximal and distal muscle weakness, hyperreflexia of left lower extremity, left-sided Babinski sign, mild fixation of left upper extremity upon deep rotating movement,  Above findings localized lesion to thoracic spine and above  MRI of the brain showed multiple T2/flair hyperintensity lesions, which raise the possibility of MS versus small vessel disease  Complete evaluation with MRI of thoracic spine with without contrast, MRI of the brain, and cervical spine with contrast,  Laboratory evaluations, including B12, copper level,  Refer to physical therapy  Fluoroscopy guided lumbar puncture for MS panel   Marcial Pacas, M.D. Ph.D.  [facility] [address] [place], [place] [address] Ph: [phone] Fax: [phone]  CC: Referring Provider

## 2019-04-24 ENCOUNTER — Telehealth: Payer: Self-pay | Admitting: Neurology

## 2019-04-24 NOTE — Telephone Encounter (Signed)
[employer] order sent to [facility]. They will obtain the auth and reach out to the patient to schedule.  

## 2019-04-25 LAB — VITAMIN D 25 HYDROXY (VIT D DEFICIENCY, FRACTURES): Vit D, 25-Hydroxy: 11.2 ng/mL — ABNORMAL LOW (ref 30.0–100.0)

## 2019-04-25 LAB — CK: Total CK: 56 U/L (ref 32–182)

## 2019-04-25 LAB — SEDIMENTATION RATE: Sed Rate: 24 mm/hr (ref 0–32)

## 2019-04-25 LAB — ANA W/REFLEX IF POSITIVE: Anti Nuclear Antibody (ANA): NEGATIVE

## 2019-04-25 LAB — RPR: RPR Ser Ql: NONREACTIVE

## 2019-04-25 LAB — HIV ANTIBODY (ROUTINE TESTING W REFLEX): HIV Screen 4th Generation wRfx: NONREACTIVE

## 2019-04-25 LAB — TSH: TSH: 2.65 u[IU]/mL (ref 0.450–4.500)

## 2019-04-25 LAB — B. BURGDORFI ANTIBODIES: Lyme IgG/IgM Ab: <0.91 {ISR} (ref 0.00–0.90)

## 2019-04-25 LAB — FOLATE: Folate: 9.7 ng/mL (ref >3.0)

## 2019-04-25 LAB — C-REACTIVE PROTEIN: CRP: 11 mg/L — ABNORMAL HIGH (ref 0–10)

## 2019-04-25 LAB — COPPER, SERUM: Copper: 160 ug/dL — ABNORMAL HIGH (ref 80–158)

## 2019-04-25 LAB — VITAMIN B12: Vitamin B-12: 344 pg/mL (ref 232–1245)

## 2019-04-25 LAB — ANGIOTENSIN CONVERTING ENZYME: Angio Convert Enzyme: 53 U/L (ref 14–82)

## 2019-04-28 ENCOUNTER — Telehealth: Payer: Self-pay | Admitting: Neurology

## 2019-04-28 NOTE — Telephone Encounter (Signed)
Left message with her lab results and vitamin D3 supplement recommendations on her voicemail. Provided our number to call back with any questions.

## 2019-04-28 NOTE — Telephone Encounter (Signed)
Please call patient, laboratory evaluations showed decreased vitamin D 11, you should take vitamin D3 supplement, 2000 units daily,  Rest of the laboratory evaluation showed no significant abnormalities.

## 2019-05-12 ENCOUNTER — Telehealth: Payer: Self-pay | Admitting: Neurology

## 2019-05-12 DIAGNOSIS — R933 Abnormal findings on diagnostic imaging of other parts of digestive tract: Secondary | ICD-10-CM

## 2019-05-12 NOTE — Telephone Encounter (Signed)
Calling to touch base in regards to orders that were sent over.

## 2019-05-13 NOTE — Telephone Encounter (Signed)
Per Miaram at [facility] the MRI Brain and Cervical  needs to be switched to w/wo contrast . When you get a chance can you switch the orders. Thank you!!

## 2019-05-13 NOTE — Telephone Encounter (Signed)
Noted, order sent to [facility] they will reach out to the patient to schedule.  

## 2019-05-21 ENCOUNTER — Other Ambulatory Visit: Payer: Self-pay | Admitting: Neurology

## 2019-05-28 NOTE — Telephone Encounter (Signed)
Jessica Hernandez: [id number] (exp. 05/27/19 to 11/23/19) patient is scheduled at [facility] for 06/13/19.

## 2019-06-05 ENCOUNTER — Ambulatory Visit: Payer: 59 | Admitting: Neurology

## 2019-06-13 ENCOUNTER — Other Ambulatory Visit: Payer: Self-pay | Admitting: Neurology

## 2019-06-13 ENCOUNTER — Ambulatory Visit
Admission: RE | Admit: 2019-06-13 | Discharge: 2019-06-13 | Disposition: A | Discharge status: Home / Self Care | Payer: 59 | Source: Ambulatory Visit | Attending: Neurology | Admitting: Neurology

## 2019-06-13 DIAGNOSIS — R933 Abnormal findings on diagnostic imaging of other parts of digestive tract: Secondary | ICD-10-CM

## 2019-06-13 DIAGNOSIS — R29898 Other symptoms and signs involving the musculoskeletal system: Secondary | ICD-10-CM

## 2019-06-13 DIAGNOSIS — R269 Unspecified abnormalities of gait and mobility: Secondary | ICD-10-CM

## 2019-06-13 DIAGNOSIS — R9089 Other abnormal findings on diagnostic imaging of central nervous system: Secondary | ICD-10-CM

## 2019-06-13 MED ORDER — GADOBENATE DIMEGLUMINE 529 MG/ML IV SOLN
20.0000 mL | Freq: Once | INTRAVENOUS | Status: AC | PRN
  Administered 2019-06-13: 20 mL via INTRAVENOUS

## 2019-06-16 ENCOUNTER — Telehealth: Payer: Self-pay | Admitting: Neurology

## 2019-06-16 NOTE — Telephone Encounter (Signed)
Left patient a detailed message, with results, on voicemail (ok per DPR).  I also notified her that Dr. Terrace Arabia would like her to move forward with the LP. We will need to adjust her follow up appt to be afer her LP. The appt on 06/19/19 has been canceled. Provided our number to call back with any questions.

## 2019-06-16 NOTE — Telephone Encounter (Signed)
Noted Jenel Lucks has reached out to patient and she is going to call back to reschedule her LP.

## 2019-06-16 NOTE — Telephone Encounter (Signed)
IMPRESSION: This MRI of the thoracic spine with and without contrast shows the following: 1.    T2 hyperintense focus in the left lateral spinal cord adjacent to T5.  There are nonspecific, it is consistent with a chronic demyelinating plaque 2.    Normal enhancement pattern.  MRI of the brain and cervical spine showed no contrast-enhancing lesions,   Please proceed with lumbar puncture as previously ordered, move her follow-up visit after lumbar puncture

## 2019-06-18 ENCOUNTER — Ambulatory Visit: Payer: 59 | Attending: Neurology

## 2019-06-18 ENCOUNTER — Other Ambulatory Visit: Payer: Self-pay

## 2019-06-18 DIAGNOSIS — R2689 Other abnormalities of gait and mobility: Secondary | ICD-10-CM | POA: Insufficient documentation

## 2019-06-18 DIAGNOSIS — M6281 Muscle weakness (generalized): Secondary | ICD-10-CM | POA: Diagnosis present

## 2019-06-18 NOTE — Therapy (Signed)
[facility] [address] [place], [place], [address] Phone: [phone]   Fax:  [phone]  Physical Therapy Evaluation  Patient Details  Name: Jessica Hernandez MRN: [id number] Date of Birth: [birth year] Referring Provider (PT): Levert Feinstein   Encounter Date: 06/18/2019  PT End of Session - 06/18/19 1151    Visit Number  1    Number of Visits  12    Date for PT Re-Evaluation  08/17/19    PT Start Time  1148    PT Stop Time  1224    PT Time Calculation (min)  36 min    Activity Tolerance  Patient tolerated treatment well    Behavior During Therapy  [facility] for tasks assessed/performed       Past Medical History:  Diagnosis Date  . Back pain   . Diabetes (HCC)   . Hypertension   . Left leg weakness   . Obesity   . Right leg pain     Past Surgical History:  Procedure Laterality Date  . CHOLECYSTECTOMY    . FOOT SURGERY     age 44    There were no vitals filed for this visit.   Subjective Assessment - 06/18/19 1152    Subjective  Symptoms first started 2015 with leg weakness, 02/2018 LBP radiating to RLE and 10/2018 left hand weakness, 02/2019 N/T anterior abdomen and along left thoracic region. Will be going for lumbar puncture as well as they are considering MS as possibility. Pt reports that putting on socks and shoes is difficult. Pain in leg limits longer distances.    Diagnostic tests  MRI of brain, multiple T2/flair hyperintensity within the deep periventricular white multiple T2/flair hyperintensity lesion within the deep periventricular white matter, differentiation diagnosis including multiple sclerosis versus small vessel diseaseMRI of the cervical spine no intrinsic cord lesion, no significant degenerative disease MRI of lumbar, moderate facet hypertrophy at L4-5, with prominent infusion in the joints bilaterally, no significant canal or foraminal narrowing    Patient Stated Goals  Pt would like to be able to  climb stairs without rails and be able to donn socks/shoes/wear wedge shoe.    Currently in Pain?  Yes    Pain Score  1    worst 7/10   Pain Location  Hip    Pain Orientation  Right    Pain Descriptors / Indicators  Radiating    Pain Type  Chronic pain    Aggravating Factors   when gets up. Has had epidural injection with no relief in December.         [facility] PT Assessment - 06/18/19 1158      Assessment   Medical Diagnosis  gait abnormality, LLE weakness    Referring Provider (PT)  Levert Feinstein    Onset Date/Surgical Date  --   2015 symptoms started   Hand Dominance  Right    Prior Therapy  no      Balance Screen   Has the patient fallen in the past 6 months  No    Has the patient had a decrease in activity level because of a fear of falling?   No    Is the patient reluctant to leave their home because of a fear of falling?   No      Home Environment   Living Environment  Private residence    Living Arrangements  Children   70 y/o daughter   Available Help at Discharge  Family  daughter at school during the day   Type of [place] to enter    Entrance Stairs-Number of Steps  7-8    Entrance Stairs-Rails  Right;Left;Cannot reach both    [place]  One level    [place]  None      Prior Function   Level of Independence  Independent with basic ADLs;Independent with community mobility without device    Vocation  Full time employment   telephonic nurse   Vocation Requirements  on phone and computer a lot during the day. Does get up every hour. Working from home    Leisure  reports that she just stays home      Cognition   Overall Cognitive Status  Within Functional Limits for tasks assessed      Observation/Other Assessments-Edema    Edema  --   edema left foot and lower leg with +1 pitting in foot/ankle     Sensation   Light Touch  Appears Intact    Additional Comments  intact in feet and hands      Coordination   Fine Motor  Movements are Fluid and Coordinated  Yes   intact RAMs     ROM / Strength   AROM / PROM / Strength  Strength      Strength   Strength Assessment Site  Shoulder;Elbow;Hand;Hip;Knee;Ankle    Right/Left Shoulder  Right;Left    Right Shoulder Flexion  5/5    Left Shoulder Flexion  5/5    Right/Left Elbow  Right;Left    Right Elbow Flexion  5/5    Right Elbow Extension  5/5    Left Elbow Flexion  5/5    Left Elbow Extension  5/5    Right/Left hand  Right;Left    Right Hand Gross Grasp  Functional    Left Hand Gross Grasp  Functional   slightly less than right   Right/Left Hip  Right;Left    Right Hip Flexion  4+/5    Right Hip ABduction  4+/5    Left Hip Flexion  2-/5    Left Hip ABduction  2-/5    Right/Left Knee  Right;Left    Right Knee Flexion  4+/5    Right Knee Extension  5/5    Left Knee Flexion  3+/5    Left Knee Extension  4/5    Right/Left Ankle  Right;Left    Right Ankle Dorsiflexion  5/5    Right Ankle Inversion  5/5    Right Ankle Eversion  5/5    Left Ankle Dorsiflexion  2-/5    Left Ankle Plantar Flexion  2-/5    Left Ankle Inversion  2-/5    Left Ankle Eversion  2-/5      Bed Mobility   Bed Mobility  Supine to Sit;Sit to Supine;Rolling Right;Rolling Left    Rolling Right  Independent    Rolling Left  Independent    Supine to Sit  Independent    Sit to Supine  Independent      Transfers   Transfers  Sit to Stand;Stand to Sit    Sit to Stand  6: Modified independent (Device/Increase time)    Stand to Sit  6: Modified independent (Device/Increase time)    Comments  30 sec sit to stand=10 reps with UE support from chair      Ambulation/Gait   Ambulation/Gait  Yes    Ambulation/Gait Assistance  5: Supervision  Ambulation Distance (Feet)  100 Feet    Assistive device  None    Gait Pattern  Step-through pattern;Decreased step length - right;Decreased hip/knee flexion - left   decreased toe off left   Ambulation Surface  Level;Indoor    Gait velocity   10.27 sec=0.97 m/s    Stairs  Yes    Stairs Assistance  5: Supervision    Stair Management Technique  Two rails;Step to pattern    Number of Stairs  4      Functional Gait  Assessment   Gait assessed   Yes    Gait Level Surface  Walks 20 ft in less than 7 sec but greater than 5.5 sec, uses assistive device, slower speed, mild gait deviations, or deviates 6-10 in outside of the 12 in walkway width.    Change in Gait Speed  Able to smoothly change walking speed without loss of balance or gait deviation. Deviate no more than 6 in outside of the 12 in walkway width.    Gait with Horizontal Head Turns  Performs head turns smoothly with slight change in gait velocity (eg, minor disruption to smooth gait path), deviates 6-10 in outside 12 in walkway width, or uses an assistive device.    Gait with Vertical Head Turns  Performs head turns with no change in gait. Deviates no more than 6 in outside 12 in walkway width.    Gait and Pivot Turn  Turns slowly, requires verbal cueing, or requires several small steps to catch balance following turn and stop    Step Over Obstacle  Is able to step over one shoe box (4.5 in total height) but must slow down and adjust steps to clear box safely. May require verbal cueing.    Gait with Narrow Base of Support  Ambulates less than 4 steps heel to toe or cannot perform without assistance.    Gait with Eyes Closed  Walks 20 ft, uses assistive device, slower speed, mild gait deviations, deviates 6-10 in outside 12 in walkway width. Ambulates 20 ft in less than 9 sec but greater than 7 sec.    Ambulating Backwards  Walks 20 ft, uses assistive device, slower speed, mild gait deviations, deviates 6-10 in outside 12 in walkway width.    Steps  Two feet to a stair, must use rail.    Total Score  17                Objective measurements completed on examination: See above findings.              PT Education - 06/18/19 1223    Education Details  Pt  instructed in PT plan of care. Instructed in sit to stand 5 x 2, 2 x/day.       PT Short Term Goals - 06/18/19 1852      PT SHORT TERM GOAL #1   Title  Pt will be independent in initial HEP for strength and balance.    Time  4    Period  Weeks    Status  New    Target Date  07/18/19      PT SHORT TERM GOAL #2   Title  Pt will be instructed on pain management techniques for right hip area to verbalize understanding.    Time  4    Period  Weeks    Status  New    Target Date  07/18/19      PT SHORT TERM GOAL #3  Title  Pt will increase 30 sec sit to stand reps from 10 to 12 or more for improved functional strength.    Baseline  10 reps from chair with UE support    Time  4    Period  Weeks    Status  New    Target Date  07/18/19        PT Long Term Goals - 06/18/19 1854      PT LONG TERM GOAL #1   Title  Pt will be independent with progressive HEP for strengthening and balance to continue gains on own.    Time  8    Period  Weeks    Status  New    Target Date  08/17/19      PT LONG TERM GOAL #2   Title  Pt will increase gait speed from 0.20m/s to >1.36m/s for improved gait safety.    Baseline  0.60m/s    Time  8    Period  Weeks    Status  New    Target Date  08/17/19      PT LONG TERM GOAL #3   Title  Pt will increase FGA from 17/30 to >21/30 for improved balance and gait safety.    Baseline  17/30    Time  8    Period  Weeks    Status  New    Target Date  08/17/19      PT LONG TERM GOAL #4   Title  Pt will ambulate >500' on varied surfaces independently for improved community mobility.    Time  8    Period  Weeks    Status  New    Target Date  08/17/19      PT LONG TERM GOAL #5   Title  Pt will be able to ascend/descend steps in reciprocal pattern with 1 rail mod I for improved functional strength and mobility.    Time  8    Period  Weeks    Status  New    Target Date  08/17/19             Plan - 06/18/19 1845    Clinical Impression  Statement  Pt is 44 y/o female referred for gait abnormality, LLE weakness, with abnormal MRI. Possible MS versus small vessel disease awaiting lumbar puncture. Pt has LLE weakness. She reports that she does have chronic right hip pain at times. Pt is fall risk based on FGA score of 17/30. She ambulates with gait speed decreased from normal. Decreased functional strength with 30 sec sit to stand of 10 reps. Pt will benefit from skilled PT to address strength, balance and functional mobility deficits.    Examination-Activity Limitations  Locomotion Level;Stairs    Examination-Participation Restrictions  Community Activity    Stability/Clinical Decision Making  Evolving/Moderate complexity    Clinical Decision Making  Moderate    Rehab Potential  Good    PT Frequency  2x / week   followed by 1x/week for 5 weeks   PT Duration  3 weeks    PT Treatment/Interventions  ADLs/Self Care Home Management;Aquatic Therapy;Cryotherapy;Functional mobility training;Stair training;Gait training;DME Instruction;Therapeutic activities;Therapeutic exercise;Balance training;Neuromuscular re-education;Manual techniques;Vestibular;Patient/family education;Passive range of motion    PT Next Visit Plan  Pt was given sit to stand verbally to start for HEP. Create HEP for LLE strengthening. Gait training and balance.    Consulted and Agree with Plan of Care  Patient       Patient will benefit  from skilled therapeutic intervention in order to improve the following deficits and impairments:  Abnormal gait, Decreased balance, Decreased range of motion, Decreased strength, Decreased mobility, Pain, Increased edema  Visit Diagnosis: Other abnormalities of gait and mobility  Muscle weakness (generalized)     Problem List Patient Active Problem List   Diagnosis Date Noted  . Gait abnormality 04/23/2019  . Left leg weakness 04/23/2019  . Abnormal brain MRI 04/23/2019    Ronn Melena, PT, DPT, NCS 06/18/2019, 6:57  PM  [facility] [address] [place], [place], [address] Phone: [phone]   Fax:  [phone]  Name: Serra Younan MRN: [id number] Date of Birth: 1975-10-23

## 2019-06-19 ENCOUNTER — Ambulatory Visit: Payer: 59 | Admitting: Neurology

## 2019-06-24 ENCOUNTER — Other Ambulatory Visit: Payer: Self-pay

## 2019-06-24 ENCOUNTER — Ambulatory Visit: Payer: 59

## 2019-06-24 DIAGNOSIS — M6281 Muscle weakness (generalized): Secondary | ICD-10-CM

## 2019-06-24 DIAGNOSIS — R2689 Other abnormalities of gait and mobility: Secondary | ICD-10-CM | POA: Diagnosis not present

## 2019-06-24 NOTE — Therapy (Signed)
[facility] [address] [place], [place], [address] Phone: [phone]   Fax:  [phone]  Physical Therapy Treatment  Patient Details  Name: Jessica Hernandez MRN: [id number] Date of Birth: [birth year] Referring Provider (PT): Levert Feinstein   Encounter Date: 06/24/2019  PT End of Session - 06/24/19 0807    Visit Number  2    Number of Visits  12    Date for PT Re-Evaluation  08/17/19    PT Start Time  0802    PT Stop Time  0845    PT Time Calculation (min)  43 min    Activity Tolerance  Patient tolerated treatment well    Behavior During Therapy  [facility] for tasks assessed/performed       Past Medical History:  Diagnosis Date  . Back pain   . Diabetes (HCC)   . Hypertension   . Left leg weakness   . Obesity   . Right leg pain     Past Surgical History:  Procedure Laterality Date  . CHOLECYSTECTOMY    . FOOT SURGERY     age 44    There were no vitals filed for this visit.  Subjective Assessment - 06/24/19 0804    Subjective  Pt reports that mornings are not good for her as her right hip/leg really hurts. Did not sleep well last night due to it. Pt reports that she had to sit up in order to get any comfort.    Diagnostic tests  MRI of brain, multiple T2/flair hyperintensity within the deep periventricular white multiple T2/flair hyperintensity lesion within the deep periventricular white matter, differentiation diagnosis including multiple sclerosis versus small vessel diseaseMRI of the cervical spine no intrinsic cord lesion, no significant degenerative disease MRI of lumbar, moderate facet hypertrophy at L4-5, with prominent infusion in the joints bilaterally, no significant canal or foraminal narrowing    Patient Stated Goals  Pt would like to be able to climb stairs without rails and be able to donn socks/shoes/wear wedge shoe.    Currently in Pain?  Yes    Pain Score  10-Worst pain ever    Pain Location  Hip    Pain Orientation  Right    Pain Descriptors / Indicators  Aching;Radiating    Pain Type  Chronic pain    Aggravating Factors   4pm on she feels a lot better until around 3 in the morning.                       [facility] Adult PT Treatment/Exercise - 06/24/19 0807      Ambulation/Gait   Ambulation/Gait  Yes    Ambulation Distance (Feet)  --   In/out of clinic   Assistive device  None      Exercises   Exercises  Other Exercises    Other Exercises   Supine right knee to chest stretch 30 sec x 2, right piriformis stretch 30 sec x 2. Sit to stand 5 x 2 from edge of mat, seated left LAQ 10 x 2 with 3# ankle weight, seated left hip flexion x 10 through partial range. Supine left heel slides on physioball x 10 with PT stabilizing to keep hip in neutral. Hooklying bilateral ER with red theraband 10 x 2. Pt denied any pain in right hip when performed. Bridges with red theraband around thighs 10 x 2 with only slight discomfort in right hip. Left hip abd x 10 supine. Sidelying for right  clamshell x 10 with partial range. Pt was able to tolerate laying on right hip ok for brief period with exercise. Pt reported 0/10 pain at end of session.      Manual Therapy   Manual therapy comments  Right hip distraction 4 bouts 20 sec through ankle.             PT Education - 06/24/19 1833    Education Details  Pt was given initial HEP for strengthening.    Person(s) Educated  Patient    Methods  Explanation;Demonstration;Handout    Comprehension  Verbalized understanding;Returned demonstration       PT Short Term Goals - 06/18/19 1852      PT SHORT TERM GOAL #1   Title  Pt will be independent in initial HEP for strength and balance.    Time  4    Period  Weeks    Status  New    Target Date  07/18/19      PT SHORT TERM GOAL #2   Title  Pt will be instructed on pain management techniques for right hip area to verbalize understanding.    Time  4    Period  Weeks    Status  New     Target Date  07/18/19      PT SHORT TERM GOAL #3   Title  Pt will increase 30 sec sit to stand reps from 10 to 12 or more for improved functional strength.    Baseline  10 reps from chair with UE support    Time  4    Period  Weeks    Status  New    Target Date  07/18/19        PT Long Term Goals - 06/18/19 1854      PT LONG TERM GOAL #1   Title  Pt will be independent with progressive HEP for strengthening and balance to continue gains on own.    Time  8    Period  Weeks    Status  New    Target Date  08/17/19      PT LONG TERM GOAL #2   Title  Pt will increase gait speed from 0.63m/s to >1.52m/s for improved gait safety.    Baseline  0.62m/s    Time  8    Period  Weeks    Status  New    Target Date  08/17/19      PT LONG TERM GOAL #3   Title  Pt will increase FGA from 17/30 to >21/30 for improved balance and gait safety.    Baseline  17/30    Time  8    Period  Weeks    Status  New    Target Date  08/17/19      PT LONG TERM GOAL #4   Title  Pt will ambulate >500' on varied surfaces independently for improved community mobility.    Time  8    Period  Weeks    Status  New    Target Date  08/17/19      PT LONG TERM GOAL #5   Title  Pt will be able to ascend/descend steps in reciprocal pattern with 1 rail mod I for improved functional strength and mobility.    Time  8    Period  Weeks    Status  New    Target Date  08/17/19            Plan -  06/24/19 1448    Clinical Impression Statement  No pain with hip motions, hip distraction felt good, SLR negative on right, pain with sidelying central and unilateral spinal mobs L4-5 level in to bottom. Pt responded well to hip distraction and hip/core strengthening HEP with 0/10 pain after.    Examination-Activity Limitations  Locomotion Level;Stairs    Examination-Participation Restrictions  Community Activity    Stability/Clinical Decision Making  Evolving/Moderate complexity    Rehab Potential  Good    PT  Frequency  2x / week   followed by 1x/week for 5 weeks   PT Duration  3 weeks    PT Treatment/Interventions  ADLs/Self Care Home Management;Aquatic Therapy;Cryotherapy;Functional mobility training;Stair training;Gait training;DME Instruction;Therapeutic activities;Therapeutic exercise;Balance training;Neuromuscular re-education;Manual techniques;Vestibular;Patient/family education;Passive range of motion    PT Next Visit Plan  How is HEP going? Pain in right hip/low back? Continue with hip strengthening and LLE strengthening. Gait training and balance.    Consulted and Agree with Plan of Care  Patient       Patient will benefit from skilled therapeutic intervention in order to improve the following deficits and impairments:  Abnormal gait, Decreased balance, Decreased range of motion, Decreased strength, Decreased mobility, Pain, Increased edema  Visit Diagnosis: Muscle weakness (generalized)     Problem List Patient Active Problem List   Diagnosis Date Noted  . Gait abnormality 04/23/2019  . Left leg weakness 04/23/2019  . Abnormal brain MRI 04/23/2019    Ronn Melena, PT, DPT, NCS 06/24/2019, 6:37 PM  [facility] [address] [place], [place], [address] Phone: [phone]   Fax:  [phone]  Name: Jessica Hernandez MRN: [id number] Date of Birth: [birth year]

## 2019-06-24 NOTE — Patient Instructions (Signed)
Access Code: [email or web address] URL: [email or web address] Date: 06/24/2019 Prepared by: Elmer Bales  Exercises Supine Single Knee to Chest Stretch - 2 x daily - 7 x weekly - 1 sets - 4 reps - 30 sec-1 min hold Supine Piriformis Stretch with Leg Straight - 2 x daily - 7 x weekly - 1 sets - 4 reps - 30 sec-1 min hold Sit to Stand - 1 x daily - 7 x weekly - 2 sets - 5 reps Seated Long Arc Quad - 1 x daily - 7 x weekly - 2 sets - 10 reps Supine Bridge with Resistance Band - 1 x daily - 7 x weekly - 2 sets - 10 reps Bent Knee Fallouts - 1 x daily - 7 x weekly - 2 sets - 10 reps Clamshell - 1 x daily - 7 x weekly - 2 sets - 10 reps

## 2019-06-26 ENCOUNTER — Ambulatory Visit: Payer: 59

## 2019-06-26 DIAGNOSIS — E041 Nontoxic single thyroid nodule: Secondary | ICD-10-CM | POA: Insufficient documentation

## 2019-06-27 ENCOUNTER — Ambulatory Visit: Payer: 59

## 2019-06-30 ENCOUNTER — Ambulatory Visit: Payer: No Typology Code available for payment source | Attending: Neurology

## 2019-06-30 ENCOUNTER — Other Ambulatory Visit: Payer: Self-pay

## 2019-06-30 DIAGNOSIS — M6281 Muscle weakness (generalized): Secondary | ICD-10-CM | POA: Insufficient documentation

## 2019-06-30 DIAGNOSIS — R2689 Other abnormalities of gait and mobility: Secondary | ICD-10-CM | POA: Insufficient documentation

## 2019-06-30 NOTE — Therapy (Signed)
[facility] [address] [place], [place], [address] Phone: [phone]   Fax:  [phone]  Physical Therapy Treatment  Patient Details  Name: Jessica Hernandez MRN: [id number] Date of Birth: April 06, 1975 Referring Provider (PT): Levert Feinstein   Encounter Date: 06/30/2019  PT End of Session - 06/30/19 0722    Visit Number  3    Number of Visits  12    Date for PT Re-Evaluation  08/17/19    PT Start Time  0716    PT Stop Time  0759    PT Time Calculation (min)  43 min    Activity Tolerance  Patient tolerated treatment well    Behavior During Therapy  [facility] for tasks assessed/performed       Past Medical History:  Diagnosis Date  . Back pain   . Diabetes (HCC)   . Hypertension   . Left leg weakness   . Obesity   . Right leg pain     Past Surgical History:  Procedure Laterality Date  . CHOLECYSTECTOMY    . FOOT SURGERY     age 66    There were no vitals filed for this visit.  Subjective Assessment - 06/30/19 0719    Subjective  Pt reports that she is really hurting this morning as mornings are always rough. She got some disturbing news that A1c was 14 up from 7. Pt reports that she has been scheduled for endocrinologist appointments. Pt had not been feeling bad or anything.    Diagnostic tests  MRI of brain, multiple T2/flair hyperintensity within the deep periventricular white multiple T2/flair hyperintensity lesion within the deep periventricular white matter, differentiation diagnosis including multiple sclerosis versus small vessel diseaseMRI of the cervical spine no intrinsic cord lesion, no significant degenerative disease MRI of lumbar, moderate facet hypertrophy at L4-5, with prominent infusion in the joints bilaterally, no significant canal or foraminal narrowing    Patient Stated Goals  Pt would like to be able to climb stairs without rails and be able to donn socks/shoes/wear wedge shoe.    Currently in Pain?   Yes    Pain Score  10-Worst pain ever    Pain Location  Hip    Pain Orientation  Right    Pain Descriptors / Indicators  Aching    Pain Type  Chronic pain                       [facility] Adult PT Treatment/Exercise - 06/30/19 0722      Ambulation/Gait   Ambulation/Gait  Yes      Exercises   Exercises  Other Exercises    Other Exercises   Hooklying bilateral hip ER with red theraband 10 x 2,  bridges with red theraband around thighs 10 x 2, clamshells 10 x 2 with red theraband with right and none on left. Trunk rotation x 10 to each side in hooklying. Sidestepping along // bar 8' x 4, step-ups on  4"step x 10 forward, lateral and step-down with left leg. Left standing hip ext 10 x 2 with red therband with verbal cues for form, left hip abd x 10 with red theraband through partial range. Pain 8/10 in right hip after exercises.      Manual Therapy   Manual therapy comments  Right hip distraction through ankle 4 bouts 20 sec, A/P hip mobs through knee  3 bouts 20 sec grade 3.  PT Education - 06/30/19 0803    Education Details  Added to HEP. Pt to perform 3 way resisted standing hip exercise in extension only    Person(s) Educated  Patient    Methods  Explanation;Demonstration;Handout    Comprehension  Verbalized understanding;Returned demonstration       PT Short Term Goals - 06/18/19 1852      PT SHORT TERM GOAL #1   Title  Pt will be independent in initial HEP for strength and balance.    Time  4    Period  Weeks    Status  New    Target Date  07/18/19      PT SHORT TERM GOAL #2   Title  Pt will be instructed on pain management techniques for right hip area to verbalize understanding.    Time  4    Period  Weeks    Status  New    Target Date  07/18/19      PT SHORT TERM GOAL #3   Title  Pt will increase 30 sec sit to stand reps from 10 to 12 or more for improved functional strength.    Baseline  10 reps from chair with UE support    Time  4     Period  Weeks    Status  New    Target Date  07/18/19        PT Long Term Goals - 06/18/19 1854      PT LONG TERM GOAL #1   Title  Pt will be independent with progressive HEP for strengthening and balance to continue gains on own.    Time  8    Period  Weeks    Status  New    Target Date  08/17/19      PT LONG TERM GOAL #2   Title  Pt will increase gait speed from 0.82m/s to >1.37m/s for improved gait safety.    Baseline  0.81m/s    Time  8    Period  Weeks    Status  New    Target Date  08/17/19      PT LONG TERM GOAL #3   Title  Pt will increase FGA from 17/30 to >21/30 for improved balance and gait safety.    Baseline  17/30    Time  8    Period  Weeks    Status  New    Target Date  08/17/19      PT LONG TERM GOAL #4   Title  Pt will ambulate >500' on varied surfaces independently for improved community mobility.    Time  8    Period  Weeks    Status  New    Target Date  08/17/19      PT LONG TERM GOAL #5   Title  Pt will be able to ascend/descend steps in reciprocal pattern with 1 rail mod I for improved functional strength and mobility.    Time  8    Period  Weeks    Status  New    Target Date  08/17/19            Plan - 06/30/19 0805    Clinical Impression Statement  Pt was able to progress to standing exercises but is limited by pain in right hip with activities. Down some to 8/10 from 10/10 throughout session. Pt did report decrease with manual techniques.    Examination-Activity Limitations  Locomotion Level;Stairs    Examination-Participation Restrictions  Community Activity    Stability/Clinical Decision Making  Evolving/Moderate complexity    Rehab Potential  Good    PT Frequency  2x / week   followed by 1x/week for 5 weeks   PT Duration  3 weeks    PT Treatment/Interventions  ADLs/Self Care Home Management;Aquatic Therapy;Cryotherapy;Functional mobility training;Stair training;Gait training;DME Instruction;Therapeutic activities;Therapeutic  exercise;Balance training;Neuromuscular re-education;Manual techniques;Vestibular;Patient/family education;Passive range of motion    PT Next Visit Plan  Manual techniques for right hip pain. Continue with hip strengthening and LLE strengthening. Gait training and balance.    Consulted and Agree with Plan of Care  Patient       Patient will benefit from skilled therapeutic intervention in order to improve the following deficits and impairments:  Abnormal gait, Decreased balance, Decreased range of motion, Decreased strength, Decreased mobility, Pain, Increased edema  Visit Diagnosis: Other abnormalities of gait and mobility  Muscle weakness (generalized)     Problem List Patient Active Problem List   Diagnosis Date Noted  . Gait abnormality 04/23/2019  . Left leg weakness 04/23/2019  . Abnormal brain MRI 04/23/2019    Electa Sniff, PT, DPT, NCS 06/30/2019, 8:07 AM  [facility] [address] [place], [place], [address] Phone: [phone]   Fax:  [phone]  Name: Jessica Hernandez MRN: [id number] Date of Birth: Mar 18, 1975

## 2019-06-30 NOTE — Patient Instructions (Signed)
Access Code: [email or web address] URL: [email or web address] Date: 06/30/2019 Prepared by: Elmer Bales  Exercises Supine Single Knee to Chest Stretch - 2 x daily - 7 x weekly - 1 sets - 4 reps - 30 sec-1 min hold Supine Piriformis Stretch with Leg Straight - 2 x daily - 7 x weekly - 1 sets - 4 reps - 30 sec-1 min hold Sit to Stand - 1 x daily - 7 x weekly - 2 sets - 5 reps Seated Long Arc Quad - 1 x daily - 7 x weekly - 2 sets - 10 reps Supine Bridge with Resistance Band - 1 x daily - 7 x weekly - 2 sets - 10 reps Bent Knee Fallouts - 1 x daily - 7 x weekly - 2 sets - 10 reps Clamshell - 1 x daily - 7 x weekly - 2 sets - 10 reps Standing hip ext with Resistance at Ankles and Counter Support - 1 x daily - 5 x weekly - 2 sets - 10 reps Side Stepping with Counter Support - 1 x daily - 5 x weekly - 2 sets - 10 reps Step Up - 1 x daily - 5 x weekly - 2 sets - 10 reps Lateral Step Up - 1 x daily - 5 x weekly - 2 sets - 10 reps

## 2019-07-03 ENCOUNTER — Ambulatory Visit: Payer: No Typology Code available for payment source

## 2019-07-04 ENCOUNTER — Ambulatory Visit: Payer: No Typology Code available for payment source

## 2019-07-04 ENCOUNTER — Other Ambulatory Visit: Payer: Self-pay

## 2019-07-04 DIAGNOSIS — R2689 Other abnormalities of gait and mobility: Secondary | ICD-10-CM

## 2019-07-04 DIAGNOSIS — M6281 Muscle weakness (generalized): Secondary | ICD-10-CM

## 2019-07-04 NOTE — Therapy (Signed)
[facility] [address] [place], [place], [address] Phone: [phone]   Fax:  [phone]  Physical Therapy Treatment  Patient Details  Name: Jessica Hernandez MRN: [id number] Date of Birth: 1975-05-24 Referring Provider (PT): Levert Feinstein   Encounter Date: 07/04/2019  PT End of Session - 07/04/19 0805    Visit Number  4    Number of Visits  12    Date for PT Re-Evaluation  08/17/19    PT Start Time  0802    PT Stop Time  0846    PT Time Calculation (min)  44 min    Activity Tolerance  Patient tolerated treatment well    Behavior During Therapy  [facility] for tasks assessed/performed       Past Medical History:  Diagnosis Date  . Back pain   . Diabetes (HCC)   . Hypertension   . Left leg weakness   . Obesity   . Right leg pain     Past Surgical History:  Procedure Laterality Date  . CHOLECYSTECTOMY    . FOOT SURGERY     age 44    There were no vitals filed for this visit.  Subjective Assessment - 07/04/19 0805    Subjective  Pt reports that she has not heard back on endocrinologist referral but has checked with her doctor and they said they sent referral. Pt has adjusted her insulin up some and changed her oral meds to night and has helped with her fasting glucose down to 203 as was over 300 prior. Pt's lumbar puncture has been moved up to next Thursday.    Diagnostic tests  MRI of brain, multiple T2/flair hyperintensity within the deep periventricular white multiple T2/flair hyperintensity lesion within the deep periventricular white matter, differentiation diagnosis including multiple sclerosis versus small vessel diseaseMRI of the cervical spine no intrinsic cord lesion, no significant degenerative disease MRI of lumbar, moderate facet hypertrophy at L4-5, with prominent infusion in the joints bilaterally, no significant canal or foraminal narrowing    Patient Stated Goals  Pt would like to be able to climb stairs  without rails and be able to donn socks/shoes/wear wedge shoe.    Currently in Pain?  Yes    Pain Score  6     Pain Location  Hip    Pain Orientation  Right    Pain Descriptors / Indicators  Aching    Pain Type  Chronic pain                       [facility] Adult PT Treatment/Exercise - 07/04/19 0809      Transfers   Transfers  Sit to Stand;Stand to Sit    Sit to Stand  6: Modified independent (Device/Increase time)    Stand to Sit  6: Modified independent (Device/Increase time)      Ambulation/Gait   Ambulation/Gait  Yes    Ambulation/Gait Assistance  5: Supervision    Ambulation/Gait Assistance Details  around in clinic during activities. Pt has trendelenberg left with decrease LLE stance time    Assistive device  None    Gait Pattern  Step-through pattern;Decreased stance time - left      Exercises   Exercises  Knee/Hip    Other Exercises   PT performed passive right knee to chest stretch 30 sec x 3 then passive trunk rotation in supine x 5 to each side with 10 sec holds. Hooklying bilateral bent knee fallouts with red  theraband for resistance x 10 then marching with cues to brace tummy with red theraband resistance10 x 2. Pt unable to keep pelvis level with left hip flexion. Instructed to decrease range to try to help some. Denied any increase in pain. Hooklying bridges with red theraband around thighs x 10. Sit to stand without UE support with red theraband around thighs to get more hip activation. Pt reported a little pain in right hip with rising. Side stepping with red theraband around thighs 10' x 2 then with performing over floor ladder 15' x 4. Pt had increased difficulty going to the right with left push off. Backwards gait with red therband around thighs x 20' then forwards march x 20'. Repeated twice. Pt had some right hip pain with march. Instructed to tighten tummy which decreased pain some.       Knee/Hip Exercises: Aerobic   Other Aerobic  Sci-Fit x 10 min  level 4. HR=108 after. PT educated on benefits or aerobic activity and how to use HR to monitor to try to get 60-80% of HR max. Pt was just short of this today.      Manual Therapy   Manual therapy comments  Right A/P hip mobs at 90 degrees 3 bouts 20 sec. Pt reported a little pain towards the end. Right hip distraction 3 bouts 20 sec through ankle with decreased pain.             PT Education - 07/04/19 0859    Education Details  Added resistance band for sidestepping and backwards walk instead of just static hip extension. Pt also going to check on recumbant stepper option as enjoyed today.    Person(s) Educated  Patient    Methods  Explanation;Demonstration    Comprehension  Verbalized understanding;Returned demonstration       PT Short Term Goals - 06/18/19 1852      PT SHORT TERM GOAL #1   Title  Pt will be independent in initial HEP for strength and balance.    Time  4    Period  Weeks    Status  New    Target Date  07/18/19      PT SHORT TERM GOAL #2   Title  Pt will be instructed on pain management techniques for right hip area to verbalize understanding.    Time  4    Period  Weeks    Status  New    Target Date  07/18/19      PT SHORT TERM GOAL #3   Title  Pt will increase 30 sec sit to stand reps from 10 to 12 or more for improved functional strength.    Baseline  10 reps from chair with UE support    Time  4    Period  Weeks    Status  New    Target Date  07/18/19        PT Long Term Goals - 06/18/19 1854      PT LONG TERM GOAL #1   Title  Pt will be independent with progressive HEP for strengthening and balance to continue gains on own.    Time  8    Period  Weeks    Status  New    Target Date  08/17/19      PT LONG TERM GOAL #2   Title  Pt will increase gait speed from 0.81m/s to >1.21m/s for improved gait safety.    Baseline  0.29m/s    Time  8    Period  Weeks    Status  New    Target Date  08/17/19      PT LONG TERM GOAL #3   Title  Pt  will increase FGA from 17/30 to >21/30 for improved balance and gait safety.    Baseline  17/30    Time  8    Period  Weeks    Status  New    Target Date  08/17/19      PT LONG TERM GOAL #4   Title  Pt will ambulate >500' on varied surfaces independently for improved community mobility.    Time  8    Period  Weeks    Status  New    Target Date  08/17/19      PT LONG TERM GOAL #5   Title  Pt will be able to ascend/descend steps in reciprocal pattern with 1 rail mod I for improved functional strength and mobility.    Time  8    Period  Weeks    Status  New    Target Date  08/17/19            Plan - 07/04/19 0900    Clinical Impression Statement  Pt with lower pain levels in right hip/low back today. Did vary some with activities but no increase during session. Pt was able to progress with more hip strengthening as well as incorporated more core activation as well. Started to initiate mor aerobic activity and did well on recumbant stepper today.    Examination-Activity Limitations  Locomotion Level;Stairs    Examination-Participation Restrictions  Community Activity    Stability/Clinical Decision Making  Evolving/Moderate complexity    Rehab Potential  Good    PT Frequency  2x / week   followed by 1x/week for 5 weeks   PT Duration  3 weeks    PT Treatment/Interventions  ADLs/Self Care Home Management;Aquatic Therapy;Cryotherapy;Functional mobility training;Stair training;Gait training;DME Instruction;Therapeutic activities;Therapeutic exercise;Balance training;Neuromuscular re-education;Manual techniques;Vestibular;Patient/family education;Passive range of motion    PT Next Visit Plan  Continue with aerobic activity with SciFit, possibly try elliptical next visit. Manual techniques for right hip/low back pain. Continue with hip strengthening and LLE strengthening. Gait training and balance.    Consulted and Agree with Plan of Care  Patient       Patient will benefit from  skilled therapeutic intervention in order to improve the following deficits and impairments:  Abnormal gait, Decreased balance, Decreased range of motion, Decreased strength, Decreased mobility, Pain, Increased edema  Visit Diagnosis: Other abnormalities of gait and mobility  Muscle weakness (generalized)     Problem List Patient Active Problem List   Diagnosis Date Noted  . Gait abnormality 04/23/2019  . Left leg weakness 04/23/2019  . Abnormal brain MRI 04/23/2019    Ronn Melena, PT, DPT, NCS 07/04/2019, 9:20 AM  [facility] [address] [place], [place], [address] Phone: [phone]   Fax:  [phone]  Name: Jessica Hernandez MRN: [id number] Date of Birth: 04-12-1975

## 2019-07-07 ENCOUNTER — Other Ambulatory Visit: Payer: Self-pay

## 2019-07-07 ENCOUNTER — Ambulatory Visit: Payer: No Typology Code available for payment source

## 2019-07-07 DIAGNOSIS — M6281 Muscle weakness (generalized): Secondary | ICD-10-CM

## 2019-07-07 DIAGNOSIS — R2689 Other abnormalities of gait and mobility: Secondary | ICD-10-CM

## 2019-07-07 NOTE — Therapy (Signed)
[facility] [address] [place], [place], [address] Phone: [phone]   Fax:  [phone]  Physical Therapy Treatment  Patient Details  Name: Jessica Hernandez MRN: [id number] Date of Birth: 03-20-1975 Referring Provider (PT): Levert Feinstein   Encounter Date: 07/07/2019  PT End of Session - 07/07/19 0719    Visit Number  5    Number of Visits  12    Date for PT Re-Evaluation  08/17/19    PT Start Time  0717    PT Stop Time  0759    PT Time Calculation (min)  42 min    Activity Tolerance  Patient tolerated treatment well    Behavior During Therapy  [facility] [place] for tasks assessed/performed       Past Medical History:  Diagnosis Date  . Back pain   . Diabetes (HCC)   . Hypertension   . Left leg weakness   . Obesity   . Right leg pain     Past Surgical History:  Procedure Laterality Date  . CHOLECYSTECTOMY    . FOOT SURGERY     age 45    There were no vitals filed for this visit.  Subjective Assessment - 07/07/19 0719    Subjective  Pt reports doing ok. Pt did get endocrinologist appointment for tomorrow.    Diagnostic tests  MRI of brain, multiple T2/flair hyperintensity within the deep periventricular white multiple T2/flair hyperintensity lesion within the deep periventricular white matter, differentiation diagnosis including multiple sclerosis versus small vessel diseaseMRI of the cervical spine no intrinsic cord lesion, no significant degenerative disease MRI of lumbar, moderate facet hypertrophy at L4-5, with prominent infusion in the joints bilaterally, no significant canal or foraminal narrowing    Patient Stated Goals  Pt would like to be able to climb stairs without rails and be able to donn socks/shoes/wear wedge shoe.    Currently in Pain?  Yes    Pain Score  6     Pain Location  Hip    Pain Orientation  Right    Pain Descriptors / Indicators  Aching    Pain Type  Chronic pain    Pain Radiating Towards  down  right lwg    Pain Onset  More than a month ago    Pain Frequency  Intermittent                       [facility] Adult PT Treatment/Exercise - 07/07/19 0721      Ambulation/Gait   Ambulation/Gait  Yes    Ambulation/Gait Assistance  5: Supervision    Ambulation/Gait Assistance Details  Pt was cued to tighten bottom on left with glut for less pelvic drop.    Ambulation Distance (Feet)  230 Feet    Assistive device  None    Gait Pattern  Step-through pattern;Decreased stance time - left      Exercises   Exercises  Other Exercises    Other Exercises   Pt performed sit to stand x 10 with verbal cues to tighten tummy. Step-ups x 10 on LLE forward and lateral. Pt was cued again to tighten tummy to help support back.      Knee/Hip Exercises: Aerobic   Elliptical  x 3 min level 1. Pt reported increased pain in right hip down leg to foot and had to stop. HR=110 after.    Other Aerobic  Sci-Fit x 6 min level 4 for warm up today. Pt reported some pain  in right hip and leg. Needed to pause briefly a couple times.      Manual Therapy   Manual therapy comments  Right hip passive knee to chest stretch 30 sec x 3, piriformis stretch to right hip with leg crossed over right and overpressure from PT 30 sec x 3, Manual traction through ankle on right 4 bouts of 20 sec. Pt reported decreased pain until towards end when started to feel in her glute area again. Right hip distraction using mobilization belt 3 bouts of 20 sec. Sidelying neutral gapping at L4/5 3 bouts of 20 sec. Pt reported pain in right hip with neutral gapping.             PT Education - 07/07/19 0859    Education Details  Pt to continue with current HEP    Person(s) Educated  Patient    Methods  Explanation    Comprehension  Verbalized understanding       PT Short Term Goals - 06/18/19 1852      PT SHORT TERM GOAL #1   Title  Pt will be independent in initial HEP for strength and balance.    Time  4    Period   Weeks    Status  New    Target Date  07/18/19      PT SHORT TERM GOAL #2   Title  Pt will be instructed on pain management techniques for right hip area to verbalize understanding.    Time  4    Period  Weeks    Status  New    Target Date  07/18/19      PT SHORT TERM GOAL #3   Title  Pt will increase 30 sec sit to stand reps from 10 to 12 or more for improved functional strength.    Baseline  10 reps from chair with UE support    Time  4    Period  Weeks    Status  New    Target Date  07/18/19        PT Long Term Goals - 06/18/19 1854      PT LONG TERM GOAL #1   Title  Pt will be independent with progressive HEP for strengthening and balance to continue gains on own.    Time  8    Period  Weeks    Status  New    Target Date  08/17/19      PT LONG TERM GOAL #2   Title  Pt will increase gait speed from 0.1m/s to >1.13m/s for improved gait safety.    Baseline  0.72m/s    Time  8    Period  Weeks    Status  New    Target Date  08/17/19      PT LONG TERM GOAL #3   Title  Pt will increase FGA from 17/30 to >21/30 for improved balance and gait safety.    Baseline  17/30    Time  8    Period  Weeks    Status  New    Target Date  08/17/19      PT LONG TERM GOAL #4   Title  Pt will ambulate >500' on varied surfaces independently for improved community mobility.    Time  8    Period  Weeks    Status  New    Target Date  08/17/19      PT LONG TERM GOAL #5   Title  Pt  will be able to ascend/descend steps in reciprocal pattern with 1 rail mod I for improved functional strength and mobility.    Time  8    Period  Weeks    Status  New    Target Date  08/17/19            Plan - 07/07/19 0859    Clinical Impression Statement  Pt continues to have pain in right hip that limits activities on left. She feels better in flexed positions but reports decreased pain with traction at right hip. Pt is showing improving LLE strength with being able to step up on left.     Examination-Activity Limitations  Locomotion Level;Stairs    Examination-Participation Restrictions  Community Activity    Stability/Clinical Decision Making  Evolving/Moderate complexity    Rehab Potential  Good    PT Frequency  2x / week   followed by 1x/week for 5 weeks   PT Duration  3 weeks    PT Treatment/Interventions  ADLs/Self Care Home Management;Aquatic Therapy;Cryotherapy;Functional mobility training;Stair training;Gait training;DME Instruction;Therapeutic activities;Therapeutic exercise;Balance training;Neuromuscular re-education;Manual techniques;Vestibular;Patient/family education;Passive range of motion    PT Next Visit Plan  Continue with aerobic activity with SciFit.  Manual techniques for right hip/low back pain. Continue with hip strengthening and LLE strengthening. Gait training and balance.    Consulted and Agree with Plan of Care  Patient       Patient will benefit from skilled therapeutic intervention in order to improve the following deficits and impairments:  Abnormal gait, Decreased balance, Decreased range of motion, Decreased strength, Decreased mobility, Pain, Increased edema  Visit Diagnosis: Other abnormalities of gait and mobility  Muscle weakness (generalized)     Problem List Patient Active Problem List   Diagnosis Date Noted  . Gait abnormality 04/23/2019  . Left leg weakness 04/23/2019  . Abnormal brain MRI 04/23/2019    Ronn Melena, PT, DPT, NCS 07/07/2019, 9:03 AM  [facility] [address] [place], [place], [address] Phone: [phone]   Fax:  [phone]  Name: Jessica Hernandez MRN: [id number] Date of Birth: [birth year]

## 2019-07-09 ENCOUNTER — Ambulatory Visit: Payer: No Typology Code available for payment source

## 2019-07-09 ENCOUNTER — Other Ambulatory Visit: Payer: Self-pay

## 2019-07-09 DIAGNOSIS — R2689 Other abnormalities of gait and mobility: Secondary | ICD-10-CM

## 2019-07-09 DIAGNOSIS — M6281 Muscle weakness (generalized): Secondary | ICD-10-CM

## 2019-07-09 NOTE — Therapy (Addendum)
[facility] [address] [place], [place], [address] Phone: [phone]   Fax:  [phone]  Physical Therapy Treatment  Patient Details  Name: Jessica Hernandez MRN: [id number] Date of Birth: Feb 12, 1976 Referring Provider (PT): Levert Feinstein   Encounter Date: 07/09/2019  PT End of Session - 07/09/19 0845    Visit Number  6    Number of Visits  12    Date for PT Re-Evaluation  08/17/19    PT Start Time  0804    PT Stop Time  0845    PT Time Calculation (min)  41 min    Activity Tolerance  Patient tolerated treatment well    Behavior During Therapy  [facility] for tasks assessed/performed       Past Medical History:  Diagnosis Date  . Back pain   . Diabetes (HCC)   . Hypertension   . Left leg weakness   . Obesity   . Right leg pain     Past Surgical History:  Procedure Laterality Date  . CHOLECYSTECTOMY    . FOOT SURGERY     age 44    There were no vitals filed for this visit.  Subjective Assessment - 07/09/19 0805    Subjective  Patient reports that is having pain in the R Hip this morning. Reports appt with endocrinologist went well. Reports that she has a lumbar puncture scheduled for tmrw.    Diagnostic tests  MRI of brain, multiple T2/flair hyperintensity within the deep periventricular white multiple T2/flair hyperintensity lesion within the deep periventricular white matter, differentiation diagnosis including multiple sclerosis versus small vessel diseaseMRI of the cervical spine no intrinsic cord lesion, no significant degenerative disease MRI of lumbar, moderate facet hypertrophy at L4-5, with prominent infusion in the joints bilaterally, no significant canal or foraminal narrowing    Patient Stated Goals  Pt would like to be able to climb stairs without rails and be able to donn socks/shoes/wear wedge shoe.    Currently in Pain?  Yes    Pain Score  10-Worst pain ever    Pain Location  Hip    Pain Orientation   Right    Pain Descriptors / Indicators  Aching    Pain Type  Chronic pain    Pain Radiating Towards  Radiates down right leg (extending down until thigh today)    Pain Onset  More than a month ago    Pain Frequency  Constant                       [facility] Adult PT Treatment/Exercise - 07/09/19 0846      Ambulation/Gait   Ambulation/Gait  Yes    Ambulation/Gait Assistance  5: Supervision    Ambulation/Gait Assistance Details  around in clinic with activites. Continue to demo trendelenberg gait pattern    Ambulation Distance (Feet)  100 Feet    Assistive device  None    Gait Pattern  Step-through pattern;Decreased stance time - left      Exercises   Exercises  Knee/Hip;Other Exercises    Other Exercises    Completed seated flexion with therapy ball, forward 1 x 15 with 5 sec hold at end for stretching. Completed to the L side to further promote stretching on the R hip/low back , 1 x 15 reps.       Knee/Hip Exercises: Stretches   Other Knee/Hip Stretches  all stretching completed supine on mat, including: single knee to chest stretch  on RLE, 3 x 30 secs each w/ towel. PT manually completing piriformis stretch, 3 x 30 secs with focus on moving into further range up to patients tolerance.       Knee/Hip Exercises: Supine   Other Supine Knee/Hip Exercises  Completed in hooklying: bent knee fall outs, 2 x 10 reps. with focus on control and tighten up core musculature during completion.       Manual Therapy   Manual therapy comments  Right hip distraction, 5 bouts of 20-30 secs through ankle. Pt reports improvement in symptoms with distraction.           Balance Exercises - 07/09/19 0848      Balance Exercises: Standing   Standing Eyes Opened  Narrow base of support (BOS);Foam/compliant surface;Head turns;Other reps (comment)   2 x 10 reps of horizontal head turns   Standing Eyes Closed  Narrow base of support (BOS);Foam/compliant surface;4 reps;30 secs    Other  Standing Exercises Comments  increased sway noted with vision removed. CGA throughout. Completed all balance in // bars, with intermittent UE support as needed.           PT Short Term Goals - 06/18/19 1852      PT SHORT TERM GOAL #1   Title  Pt will be independent in initial HEP for strength and balance.    Time  4    Period  Weeks    Status  New    Target Date  07/18/19      PT SHORT TERM GOAL #2   Title  Pt will be instructed on pain management techniques for right hip area to verbalize understanding.    Time  4    Period  Weeks    Status  New    Target Date  07/18/19      PT SHORT TERM GOAL #3   Title  Pt will increase 30 sec sit to stand reps from 10 to 12 or more for improved functional strength.    Baseline  10 reps from chair with UE support    Time  4    Period  Weeks    Status  New    Target Date  07/18/19        PT Long Term Goals - 06/18/19 1854      PT LONG TERM GOAL #1   Title  Pt will be independent with progressive HEP for strengthening and balance to continue gains on own.    Time  8    Period  Weeks    Status  New    Target Date  08/17/19      PT LONG TERM GOAL #2   Title  Pt will increase gait speed from 0.9m/s to >1.104m/s for improved gait safety.    Baseline  0.80m/s    Time  8    Period  Weeks    Status  New    Target Date  08/17/19      PT LONG TERM GOAL #3   Title  Pt will increase FGA from 17/30 to >21/30 for improved balance and gait safety.    Baseline  17/30    Time  8    Period  Weeks    Status  New    Target Date  08/17/19      PT LONG TERM GOAL #4   Title  Pt will ambulate >500' on varied surfaces independently for improved community mobility.    Time  8  Period  Weeks    Status  New    Target Date  08/17/19      PT LONG TERM GOAL #5   Title  Pt will be able to ascend/descend steps in reciprocal pattern with 1 rail mod I for improved functional strength and mobility.    Time  8    Period  Weeks    Status  New     Target Date  08/17/19         07/09/19 0845  Plan  Clinical Impression Statement Today's skilled therapy session focused on BLE stretching and mobility due to patient reports of increased pain. Pt responding well to traction and activities incorporating flexion. Pt will continue to benefit from skilled PT services to progress toward all unmet goals.  Examination-Activity Limitations Locomotion Level;Stairs  Examination-Participation Restrictions Community Activity  Pt will benefit from skilled therapeutic intervention in order to improve on the following deficits Abnormal gait;Decreased balance;Decreased range of motion;Decreased strength;Decreased mobility;Pain;Increased edema  Stability/Clinical Decision Making Evolving/Moderate complexity  Rehab Potential Good  PT Frequency 2x / week (followed by 1x/week for 5 weeks)  PT Duration 3 weeks  PT Treatment/Interventions ADLs/Self Care Home Management;Aquatic Therapy;Cryotherapy;Functional mobility training;Stair training;Gait training;DME Instruction;Therapeutic activities;Therapeutic exercise;Balance training;Neuromuscular re-education;Manual techniques;Vestibular;Patient/family education;Passive range of motion  PT Next Visit Plan Continue with aerobic activity with SciFit.  Manual techniques for right hip/low back pain. Continue with hip strengthening and LLE strengthening. Gait training and balance.  Consulted and Agree with Plan of Care Patient      Patient will benefit from skilled therapeutic intervention in order to improve the following deficits and impairments:     Visit Diagnosis: Other abnormalities of gait and mobility  Muscle weakness (generalized)     Problem List Patient Active Problem List   Diagnosis Date Noted  . Gait abnormality 04/23/2019  . Left leg weakness 04/23/2019  . Abnormal brain MRI 04/23/2019    Jones Bales, PT, DPT 07/09/2019, 10:16 AM  [facility] [address] [place], [place], [address] Phone: [phone]   Fax:  [phone]  Name: Zionna Homewood MRN: [id number] Date of Birth: Jul 05, 1975

## 2019-07-10 ENCOUNTER — Ambulatory Visit
Admission: RE | Admit: 2019-07-10 | Discharge: 2019-07-10 | Disposition: A | Discharge status: Home / Self Care | Payer: 59 | Source: Ambulatory Visit | Attending: Neurology | Admitting: Neurology

## 2019-07-10 ENCOUNTER — Telehealth: Payer: Self-pay | Admitting: Neurology

## 2019-07-10 DIAGNOSIS — R29898 Other symptoms and signs involving the musculoskeletal system: Secondary | ICD-10-CM

## 2019-07-10 DIAGNOSIS — R269 Unspecified abnormalities of gait and mobility: Secondary | ICD-10-CM

## 2019-07-10 DIAGNOSIS — R9089 Other abnormal findings on diagnostic imaging of central nervous system: Secondary | ICD-10-CM

## 2019-07-10 DIAGNOSIS — R933 Abnormal findings on diagnostic imaging of other parts of digestive tract: Secondary | ICD-10-CM

## 2019-07-10 IMAGING — XA DG SPINAL PUNCT LUMBAR DIAG WITH FL CT GUIDANCE
1 series · 1 of 1 positions shown · non-contrast
Comparison: none

CLINICAL DATA: Leg weakness, multiple sclerosis evaluation

[Series 1: ortho adipose · 1 of 1 slices shown]
[im 1/1]
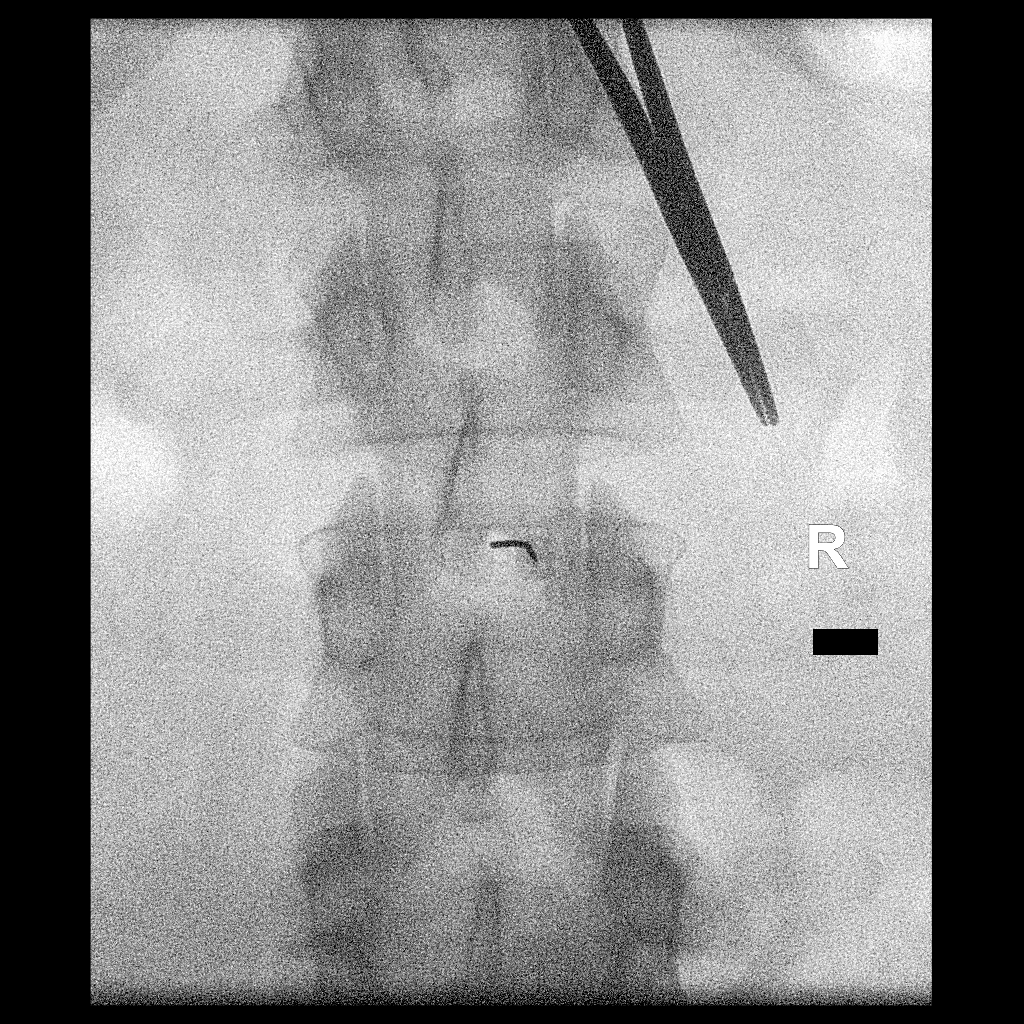

[1 of 1 positions shown; findings below may reference images not displayed]

EXAM:
DIAGNOSTIC LUMBAR PUNCTURE UNDER FLUOROSCOPIC GUIDANCE

FLUOROSCOPY TIME:  15 seconds; 43  [9D] DAP

PROCEDURE:
Informed consent was obtained from the patient prior to the
procedure, including potential complications of headache, allergy,
and pain. With the patient prone, the lower back was prepped with
Betadine. 1% Lidocaine was used for local anesthesia. Lumbar
puncture was performed at the L3 level from a right parasagittal
approach using a 6 inch 20 gauge needle with return of clear
colorless CSF with an opening pressure of 10 cm water. 9 ml of CSF
were obtained for laboratory studies. The patient tolerated the
procedure well and there were no apparent complications.
IMPRESSION: Technically successful lumbar puncture under fluoroscopy

## 2019-07-10 NOTE — Discharge Instructions (Signed)

## 2019-07-10 NOTE — Progress Notes (Signed)
Blood obtained from pt's R AC for LP labs. Pt tolerated procedure well. Site is unremarkable.  

## 2019-07-10 NOTE — Telephone Encounter (Signed)
error 

## 2019-07-11 ENCOUNTER — Other Ambulatory Visit: Payer: 59

## 2019-07-14 ENCOUNTER — Ambulatory Visit: Payer: No Typology Code available for payment source

## 2019-07-15 ENCOUNTER — Ambulatory Visit: Payer: No Typology Code available for payment source

## 2019-07-15 ENCOUNTER — Other Ambulatory Visit: Payer: Self-pay

## 2019-07-15 DIAGNOSIS — R2689 Other abnormalities of gait and mobility: Secondary | ICD-10-CM | POA: Diagnosis not present

## 2019-07-15 DIAGNOSIS — M6281 Muscle weakness (generalized): Secondary | ICD-10-CM

## 2019-07-15 NOTE — Therapy (Signed)
[facility] [address] [place], [place], [address] Phone: [phone]   Fax:  [phone]  Physical Therapy Treatment  Patient Details  Name: Jessica Hernandez MRN: [id number] Date of Birth: August 11, 1975 Referring Provider (PT): Marcial Pacas   Encounter Date: 07/15/2019  PT End of Session - 07/15/19 0807    Visit Number  7    Number of Visits  12    Date for PT Re-Evaluation  08/17/19    PT Start Time  0805    PT Stop Time  0843    PT Time Calculation (min)  38 min    Activity Tolerance  Patient tolerated treatment well    Behavior During Therapy  [facility] for tasks assessed/performed       Past Medical History:  Diagnosis Date  . Back pain   . Diabetes ([place])   . Hypertension   . Left leg weakness   . Obesity   . Right leg pain     Past Surgical History:  Procedure Laterality Date  . CHOLECYSTECTOMY    . FOOT SURGERY     age 67    There were no vitals filed for this visit.  Subjective Assessment - 07/15/19 0806    Subjective  Pt reports that she was started on ozempic. Has not checked her sugars this weekend. Pt is tearful this morning. Reports that pain is just really bad. Had lumbar puncture last week but has not gotten results yet.    Diagnostic tests  MRI of brain, multiple T2/flair hyperintensity within the deep periventricular white multiple T2/flair hyperintensity lesion within the deep periventricular white matter, differentiation diagnosis including multiple sclerosis versus small vessel diseaseMRI of the cervical spine no intrinsic cord lesion, no significant degenerative disease MRI of lumbar, moderate facet hypertrophy at L4-5, with prominent infusion in the joints bilaterally, no significant canal or foraminal narrowing    Patient Stated Goals  Pt would like to be able to climb stairs without rails and be able to donn socks/shoes/wear wedge shoe.    Currently in Pain?  Yes    Pain Score  10-Worst pain  ever    Pain Location  Hip    Pain Orientation  Right    Pain Descriptors / Indicators  Aching    Pain Onset  More than a month ago                        [facility] Adult PT Treatment/Exercise - 07/15/19 0808      Transfers   Transfers  Sit to Stand;Stand to Sit    Sit to Stand  6: Modified independent (Device/Increase time)    Stand to Sit  6: Modified independent (Device/Increase time)    Comments  9 sit to stands in 30 sec from mat without UE support. Pt had to stop briefly half way through due to pain in right hip/leg.      Ambulation/Gait   Ambulation/Gait  Yes    Ambulation/Gait Assistance  5: Supervision    Ambulation/Gait Assistance Details  Pt denied any increase pain with gait.    Ambulation Distance (Feet)  345 Feet    Assistive device  None    Gait Pattern  Step-through pattern;Decreased hip/knee flexion - left;Decreased stance time - left      Exercises   Exercises  Other Exercises    Other Exercises   Supine unilateral bent knee fall outs x 5 each side, isometric left hip abd/adduction x  10. Pt with limited hip adduction. Left heel slides with verbal cues to brace tummy x 10. Pt ER some with sliding up. Left hip/knee flexion on red physioball x 10 with PT stabilizing to keep hip in neutral. Hooklying posterior pelvic tilts x 10. Pt denied any pain. Cues to breath throughout. Seated trunk flexion with blue physioball x 10 forward and x 5 to each side with brief holds. Pt reported feeling good stretch in back.      Manual Therapy   Manual Therapy  Other (comment)    Manual therapy comments  Passive right knee to chest 30 sec x 3, right piriformis stretch 30 sec x 2, right hip distraction 3 bouts of 20 sec. Pt reporting pain in right hip and down thigh to calf area.     Other Manual Therapy  Pain 8/10 after manual techniques and exercises supine             PT Education - 07/15/19 1043    Education Details  Pt to continue with current HEP. Discussed  working on seated trunk flexion multiple times during day especially if back bothering her more.    Person(s) Educated  Patient    Methods  Explanation    Comprehension  Verbalized understanding       PT Short Term Goals - 07/15/19 1044      PT SHORT TERM GOAL #1   Title  Pt will be independent in initial HEP for strength and balance.    Baseline  Pt reports performing initial HEP.    Time  4    Period  Weeks    Status  Achieved    Target Date  07/18/19      PT SHORT TERM GOAL #2   Title  Pt will be instructed on pain management techniques for right hip area to verbalize understanding.    Baseline  Pt has been instructed in positioning and exercises/stretches to help with pain in right hip area and verbalizes understanding.    Time  4    Period  Weeks    Status  Achieved    Target Date  07/18/19      PT SHORT TERM GOAL #3   Title  Pt will increase 30 sec sit to stand reps from 10 to 12 or more for improved functional strength.    Baseline  10 reps from chair with UE support. 9 reps on 07/15/19 but was on track to beat having to stop for a few seconds due to increased pain. Pt was able to perform without UE support today.    Time  4    Period  Weeks    Status  Not Met    Target Date  07/18/19        PT Long Term Goals - 06/18/19 1854      PT LONG TERM GOAL #1   Title  Pt will be independent with progressive HEP for strengthening and balance to continue gains on own.    Time  8    Period  Weeks    Status  New    Target Date  08/17/19      PT LONG TERM GOAL #2   Title  Pt will increase gait speed from 0.72ms to >1.175m for improved gait safety.    Baseline  0.9747m   Time  8    Period  Weeks    Status  New    Target Date  08/17/19  PT LONG TERM GOAL #3   Title  Pt will increase FGA from 17/30 to >21/30 for improved balance and gait safety.    Baseline  17/30    Time  8    Period  Weeks    Status  New    Target Date  08/17/19      PT LONG TERM GOAL #4    Title  Pt will ambulate >500' on varied surfaces independently for improved community mobility.    Time  8    Period  Weeks    Status  New    Target Date  08/17/19      PT LONG TERM GOAL #5   Title  Pt will be able to ascend/descend steps in reciprocal pattern with 1 rail mod I for improved functional strength and mobility.    Time  8    Period  Weeks    Status  New    Target Date  08/17/19            Plan - 07/15/19 0843    Clinical Impression Statement  PT reassessed STGs. Met 2/3. She did not improve on 30 sec sit to stand but this was more due to increased pain she was having in right hip today having to stop half-way through due to it. Was able to perform sit to stands without UE support. Pt is most limited by pain in right hip/leg with activities. Showing some improvement in LLE strength with functional activities. Pt continues to benefit from skilled PT to address strength, pain and functional mobility deficits.    Examination-Activity Limitations  Locomotion Level;Stairs    Examination-Participation Restrictions  Community Activity    Stability/Clinical Decision Making  Evolving/Moderate complexity    Rehab Potential  Good    PT Frequency  2x / week   followed by 1x/week for 5 weeks   PT Duration  3 weeks    PT Treatment/Interventions  ADLs/Self Care Home Management;Aquatic Therapy;Cryotherapy;Functional mobility training;Stair training;Gait training;DME Instruction;Therapeutic activities;Therapeutic exercise;Balance training;Neuromuscular re-education;Manual techniques;Vestibular;Patient/family education;Passive range of motion    PT Next Visit Plan  Can we get in pool 5/24? Continue with aerobic activity with SciFit.  Manual techniques for right hip/low back pain. Continue with hip strengthening and LLE strengthening. Gait training and balance.    Consulted and Agree with Plan of Care  Patient       Patient will benefit from skilled therapeutic intervention in order to  improve the following deficits and impairments:  Abnormal gait, Decreased balance, Decreased range of motion, Decreased strength, Decreased mobility, Pain, Increased edema  Visit Diagnosis: Other abnormalities of gait and mobility  Muscle weakness (generalized)     Problem List Patient Active Problem List   Diagnosis Date Noted  . Gait abnormality 04/23/2019  . Left leg weakness 04/23/2019  . Abnormal brain MRI 04/23/2019    Electa Sniff, PT, DPT, NCS 07/15/2019, 10:48 AM  [facility] [address] [place], [place], [address] Phone: [phone]   Fax:  [phone]  Name: Jessica Hernandez MRN: [id number] Date of Birth: 1976/02/16

## 2019-07-17 ENCOUNTER — Telehealth: Payer: Self-pay | Admitting: Neurology

## 2019-07-17 NOTE — Telephone Encounter (Signed)
Please call patient, spinal fluid testing showed more than 5 oligoclonal banding, normal total protein, elevated glucose, which is usually correlate with her serum glucose level, WBC was 1, no signs of infection  Above findings support a diagnosis of multiple sclerosis,

## 2019-07-17 NOTE — Telephone Encounter (Signed)
Left message requesting a return call.

## 2019-07-17 NOTE — Telephone Encounter (Signed)
Pt called to check on the status of her lumbar puncture results

## 2019-07-17 NOTE — Telephone Encounter (Signed)
Left second message providing our office hours and phone number to call back.

## 2019-07-21 ENCOUNTER — Other Ambulatory Visit: Payer: Self-pay

## 2019-07-21 ENCOUNTER — Ambulatory Visit: Payer: No Typology Code available for payment source

## 2019-07-21 DIAGNOSIS — R2689 Other abnormalities of gait and mobility: Secondary | ICD-10-CM

## 2019-07-21 DIAGNOSIS — M6281 Muscle weakness (generalized): Secondary | ICD-10-CM

## 2019-07-21 NOTE — Telephone Encounter (Signed)
The patient called back and verbalized understanding of the results below. She has been scheduled for a follow up with Dr. Terrace Arabia on 07/23/19 to further review findings and discuss treatment options.

## 2019-07-21 NOTE — Therapy (Signed)
[facility] [address] [place], [place], [address] Phone: [phone]   Fax:  [phone]  Physical Therapy Treatment  Patient Details  Name: Jessica Hernandez MRN: [id number] Date of Birth: [birth year] Referring Provider (PT): Marcial Pacas   Encounter Date: 07/21/2019  PT End of Session - 07/21/19 0719    Visit Number  8    Number of Visits  12    Date for PT Re-Evaluation  08/17/19    PT Start Time  0718    PT Stop Time  0745   pt had to leave early for another appointment   PT Time Calculation (min)  27 min    Activity Tolerance  Patient tolerated treatment well    Behavior During Therapy  [facility] for tasks assessed/performed       Past Medical History:  Diagnosis Date  . Back pain   . Diabetes ([place])   . Hypertension   . Left leg weakness   . Obesity   . Right leg pain     Past Surgical History:  Procedure Laterality Date  . CHOLECYSTECTOMY    . FOOT SURGERY     age 62    There were no vitals filed for this visit.  Subjective Assessment - 07/21/19 0720    Subjective  Pt reports that she is doing a little better. Pain has not been as bad. She has been able to sleep in her bed as well. Sugars have been doing better than they were. Has not gotten results from lumbar puncture yet. Epic chart review shows MS.    Diagnostic tests  MRI of brain, multiple T2/flair hyperintensity within the deep periventricular white multiple T2/flair hyperintensity lesion within the deep periventricular white matter, differentiation diagnosis including multiple sclerosis versus small vessel diseaseMRI of the cervical spine no intrinsic cord lesion, no significant degenerative disease MRI of lumbar, moderate facet hypertrophy at L4-5, with prominent infusion in the joints bilaterally, no significant canal or foraminal narrowing    Patient Stated Goals  Pt would like to be able to climb stairs without rails and be able to donn  socks/shoes/wear wedge shoe.    Currently in Pain?  Yes    Pain Score  6     Pain Location  Hip    Pain Orientation  Right    Pain Descriptors / Indicators  Aching    Pain Type  Chronic pain    Pain Onset  More than a month ago                        [facility] Adult PT Treatment/Exercise - 07/21/19 0721      Ambulation/Gait   Ambulation/Gait  Yes    Ambulation/Gait Assistance  5: Supervision    Ambulation/Gait Assistance Details  In clinic for activities    Assistive device  None    Gait Pattern  Step-through pattern;Decreased hip/knee flexion - left;Decreased stance time - left      Exercises   Exercises  Other Exercises    Other Exercises   Pt performed hooklying unilateral left hip ER/IR x 10 with verbal cues to control. Left heel slides x 10 with PT helping to maintain hip in neutral. With yellow theraband around thighs bilateral hip ER/abd x 10 with cues to keep back flat but breath. Sit to stand edge of mat x 10 without hands with yellow theraband around thighs x 10. Pt reported some pain in right hip but no issues  on left. Standing at counter: sidestepping with yellow theraband around thighs 6' x 6 with verbal cues for upright posture. Pt has some right pelvic drop when pushing off left leg. Tapping cone with left foot with 1 UE support x 10 then alternating toe taps x 10 each. Pt had pelvic drop during left stance. Stepping over 2" foam beam and back x 10 with LLE forward and then lateral. Pt reports pain in right hip 8/10 with activities but not as bad as was last week.              PT Education - 07/21/19 0750    Education Details  Pt to continue with current HEP.       PT Short Term Goals - 07/15/19 1044      PT SHORT TERM GOAL #1   Title  Pt will be independent in initial HEP for strength and balance.    Baseline  Pt reports performing initial HEP.    Time  4    Period  Weeks    Status  Achieved    Target Date  07/18/19      PT SHORT TERM GOAL #2    Title  Pt will be instructed on pain management techniques for right hip area to verbalize understanding.    Baseline  Pt has been instructed in positioning and exercises/stretches to help with pain in right hip area and verbalizes understanding.    Time  4    Period  Weeks    Status  Achieved    Target Date  07/18/19      PT SHORT TERM GOAL #3   Title  Pt will increase 30 sec sit to stand reps from 10 to 12 or more for improved functional strength.    Baseline  10 reps from chair with UE support. 9 reps on 07/15/19 but was on track to beat having to stop for a few seconds due to increased pain. Pt was able to perform without UE support today.    Time  4    Period  Weeks    Status  Not Met    Target Date  07/18/19        PT Long Term Goals - 06/18/19 1854      PT LONG TERM GOAL #1   Title  Pt will be independent with progressive HEP for strengthening and balance to continue gains on own.    Time  8    Period  Weeks    Status  New    Target Date  08/17/19      PT LONG TERM GOAL #2   Title  Pt will increase gait speed from 0.92ms to >1.145m for improved gait safety.    Baseline  0.9725m   Time  8    Period  Weeks    Status  New    Target Date  08/17/19      PT LONG TERM GOAL #3   Title  Pt will increase FGA from 17/30 to >21/30 for improved balance and gait safety.    Baseline  17/30    Time  8    Period  Weeks    Status  New    Target Date  08/17/19      PT LONG TERM GOAL #4   Title  Pt will ambulate >500' on varied surfaces independently for improved community mobility.    Time  8    Period  Weeks    Status  New    Target Date  08/17/19      PT LONG TERM GOAL #5   Title  Pt will be able to ascend/descend steps in reciprocal pattern with 1 rail mod I for improved functional strength and mobility.    Time  8    Period  Weeks    Status  New    Target Date  08/17/19            Plan - 07/21/19 0751    Clinical Impression Statement  Pt reporting less  pain in right hip today. Was able to tolerated more strengthening on LLE. Weakness is present especially in left hip musculature with functional activities.    Examination-Activity Limitations  Locomotion Level;Stairs    Examination-Participation Restrictions  Community Activity    Stability/Clinical Decision Making  Evolving/Moderate complexity    Rehab Potential  Good    PT Frequency  2x / week   followed by 1x/week for 5 weeks   PT Duration  3 weeks    PT Treatment/Interventions  ADLs/Self Care Home Management;Aquatic Therapy;Cryotherapy;Functional mobility training;Stair training;Gait training;DME Instruction;Therapeutic activities;Therapeutic exercise;Balance training;Neuromuscular re-education;Manual techniques;Vestibular;Patient/family education;Passive range of motion    PT Next Visit Plan  Continue with aerobic activity with [employer].  Manual techniques for right hip/low back pain. Continue with hip strengthening and LLE strengthening. Gait training and balance.    Consulted and Agree with Plan of Care  Patient       Patient will benefit from skilled therapeutic intervention in order to improve the following deficits and impairments:  Abnormal gait, Decreased balance, Decreased range of motion, Decreased strength, Decreased mobility, Pain, Increased edema  Visit Diagnosis: Other abnormalities of gait and mobility  Muscle weakness (generalized)     Problem List Patient Active Problem List   Diagnosis Date Noted  . Gait abnormality 04/23/2019  . Left leg weakness 04/23/2019  . Abnormal brain MRI 04/23/2019    Electa Sniff, PT, DPT, NCS 07/21/2019, 7:52 AM  [facility] [address] [place], [place], [address] Phone: [phone]   Fax:  [phone]  Name: Jessica Hernandez MRN: [id number] Date of Birth: May 29, 1975

## 2019-07-23 ENCOUNTER — Other Ambulatory Visit: Payer: Self-pay

## 2019-07-23 ENCOUNTER — Encounter: Payer: Self-pay | Admitting: Neurology

## 2019-07-23 ENCOUNTER — Ambulatory Visit (INDEPENDENT_AMBULATORY_CARE_PROVIDER_SITE_OTHER): Payer: No Typology Code available for payment source | Admitting: Neurology

## 2019-07-23 VITALS — BP 154/87 | HR 98 | Ht 68.0 in | Wt 309.0 lb

## 2019-07-23 DIAGNOSIS — G35 Multiple sclerosis: Secondary | ICD-10-CM | POA: Insufficient documentation

## 2019-07-23 DIAGNOSIS — R269 Unspecified abnormalities of gait and mobility: Secondary | ICD-10-CM

## 2019-07-23 NOTE — Progress Notes (Signed)
PATIENT: Jessica Hernandez DOB: 01-04-1976  Chief Complaint  Patient presents with  . Multiple Sclerosis    She is here to review test results and discuss MS treatment options.     HISTORICAL  Jessica Hernandez is a 44 year old female, seen in request by neurosurgeon Dr. Christella Noa, and primary care physician Dr. Susa Day for evaluation of gait abnormality, new onset paresthesia, and abnormal MRI scans, initial evaluation was on April 23, 2019.  I have reviewed and summarized the referring note from the referring physician.  She had a past medical history of insulin-dependent diabetes, obesity, hypertension,  Around 2015, she noticed gradual onset left leg difficulty, she has to use her hands to lift up her left leg when step into the car, rely on the handrail to go up stairs, she denies significant pain, or sensory loss, she was seen by outside neurologist, had MRI of lumbar spine with no clear etiology was found, her left leg weakness has been persistent over the years  January 2020, she began to develop low back pain, radiating pain to right lower extremity, along the right lateral thigh, anterior shin, bottom of her right foot, also complains of slow worsening urinary urgency,  Since September 2020, she noticed left hand weakness, she has difficulty opening bottles with her left hand, denies pain, or sensory loss  She has been followed up by her optometrist on a yearly basis, there was no visual loss  Since January 2021, she noticed numbness tingling heating sensation at the anterior abdomen region, radiating aiding along left lower thoracic region,  I personally reviewed MRIs in January 2021,  MRI of brain, multiple T2/flair hyperintensity within the deep periventricular white multiple T2/flair hyperintensity lesion within the deep periventricular white matter, differentiation diagnosis including multiple sclerosis versus small vessel disease  MRI of the cervical spine no intrinsic cord  lesion, no significant degenerative disease  MRI of lumbar, moderate facet hypertrophy at L4-5, with prominent infusion in the joints bilaterally, no significant canal or foraminal narrowing  Laboratory evaluation November 2020, CMP elevated glucose 141, creatinine 0.67 negative ANA,  UPDATE Jul 23 2019: She came in today to discuss treatment option for her relapsing remitting multiple sclerosis, which has been confirmed by her history, abnormal physical findings and abnormal MRI of thoracic, brain,  We personally reviewed MRI of thoracic spine with without contrast in April 2021, T2 hyperintensity focus in the left lateral spinal cord adjacent to T5, no contrast-enhancement  MRI of cervical spine, no contrast-enhancement, no intrinsic cord lesion  MRI of the brain only showed mild periventricular deep white matter changes, no contrast-enhancement  Reviewed laboratory evaluations,  spinal fluid testing on Jul 10, 2019 showed more than 5 oligoclonal banding, consistent diagnosis of multiple sclerosis, WBC was 1, total protein was 41 Decreased vitamin D 11, she is on vitamin D supplement, Rest of the laboratory evaluation showed normal or negative Lyme titer, ACE, ANA, copper, ESR, C-reactive protein, TSH, folic acid, I20, HIV, RPR  She complains of gait abnormality, fatigue, lower extremity swelling, significant right hip pain   REVIEW OF SYSTEMS: Full 14 system review of systems performed and notable only for as above All other review of systems were negative.  ALLERGIES: Allergies  Allergen Reactions  . Clarithromycin Other (See Comments)    Other reaction(s): trouble swallowing    HOME MEDICATIONS: Current Outpatient Medications  Medication Sig Dispense Refill  . acetaminophen (TYLENOL) 500 MG tablet Take 2 tablets by mouth daily as needed.     Marland Kitchen  ASPIRIN 81 PO Take 81 mg by mouth daily.     . Cholecalciferol (VITAMIN D3 PO) Take 2,000 Units by mouth daily.    . Cyanocobalamin  (VITAMIN B-12 IJ) Inject 1,000 mcg as directed once a week.    . cyclobenzaprine (FLEXERIL) 10 MG tablet Take 10 mg by mouth daily as needed for muscle spasms.    . diphenhydrAMINE (BENADRYL) 50 MG tablet Take 50 mg by mouth at bedtime as needed for itching.    . DULoxetine (CYMBALTA) 30 MG capsule Take 30 mg by mouth at bedtime.    . DULoxetine (CYMBALTA) 60 MG capsule Take 60 mg by mouth daily.    Marland Kitchen gabapentin (NEURONTIN) 300 MG capsule Take 300 mg by mouth 3 (three) times daily.    . insulin detemir (LEVEMIR) 100 UNIT/ML injection Inject 30 Units into the skin 2 (two) times daily.     Marland Kitchen JANUVIA 100 MG tablet Take 100 mg by mouth daily.    Marland Kitchen lisinopril-hydrochlorothiazide (ZESTORETIC) 10-12.5 MG tablet Take 1 tablet by mouth daily.    . metFORMIN (GLUCOPHAGE) 1000 MG tablet Take 1,000 mg by mouth 2 (two) times daily with a meal.     . Semaglutide (OZEMPIC, 0.25 OR 0.5 MG/DOSE, [place]) Inject 0.25 mg into the skin once a week.     No current facility-administered medications for this visit.    PAST MEDICAL HISTORY: Past Medical History:  Diagnosis Date  . Back pain   . Diabetes ([place])   . Hypertension   . Left leg weakness   . Obesity   . Right leg pain     PAST SURGICAL HISTORY: Past Surgical History:  Procedure Laterality Date  . CHOLECYSTECTOMY    . FOOT SURGERY     age 48    FAMILY HISTORY: Family History  Problem Relation Age of Onset  . Multiple sclerosis Sister   . Diabetes Mother   . Thyroid cancer Mother   . Hypertension Mother   . Rheum arthritis Mother   . Hypertension Father   . Prostate cancer Father   . Transient ischemic attack Father   . Healthy Brother   . Diabetes Maternal Grandmother   . Other Maternal Grandfather        unsure of medical history  . Other Paternal Grandmother        unsure of medical history  . Other Paternal Grandfather        unsure of medical history  . Healthy Sister     SOCIAL HISTORY: Social History   Socioeconomic  History  . Marital status: Single    Spouse name: Not on file  . Number of children: 1  . Years of education: college  . Highest education level: Not on file  Occupational History  . Occupation: Therapist, sports - Nutritional therapist of diseases  Tobacco Use  . Smoking status: Never Smoker  . Smokeless tobacco: Never Used  Substance and Sexual Activity  . Alcohol use: Yes    Comment: occasional wine  . Drug use: Never  . Sexual activity: Not on file  Other Topics Concern  . Not on file  Social History Narrative   Lives at home with her daughter.   Right-handed.   Caffeine use: 2 cups every other day.   Social Determinants of Health   Financial Resource Strain:   . Difficulty of Paying Living Expenses:   Food Insecurity:   . Worried About Charity fundraiser in the Last Year:   . [employer]  of Food in the Last Year:   Transportation Needs:   . Film/video editor (Medical):   Marland Kitchen Lack of Transportation (Non-Medical):   Physical Activity:   . Days of Exercise per Week:   . Minutes of Exercise per Session:   Stress:   . Feeling of Stress :   Social Connections:   . Frequency of Communication with Friends and Family:   . Frequency of Social Gatherings with Friends and Family:   . Attends Religious Services:   . Active Member of Clubs or Organizations:   . Attends Archivist Meetings:   Marland Kitchen Marital Status:   Intimate Partner Violence:   . Fear of Current or Ex-Partner:   . Emotionally Abused:   Marland Kitchen Physically Abused:   . Sexually Abused:      PHYSICAL EXAM   Vitals:   07/23/19 1500  BP: (!) 154/87  Pulse: 98  Weight: (!) 309 lb (140.2 kg)  Height: '5\' 8"'$  (1.727 m)    Not recorded      Body mass index is 46.98 kg/m.  PHYSICAL EXAMNIATION:  Gen: NAD, conversant, well nourised, well groomed                     Cardiovascular: Regular rate rhythm, no peripheral edema, warm, nontender. Eyes: Conjunctivae clear without exudates or hemorrhage Neck: Supple, no carotid  bruits. Pulmonary: Clear to auscultation bilaterally   NEUROLOGICAL EXAM:  MENTAL STATUS: Speech:    Speech is normal; fluent and spontaneous with normal comprehension.  Cognition:     Orientation to time, place and person     Normal recent and remote memory     Normal Attention span and concentration     Normal Language, naming, repeating,spontaneous speech     Fund of knowledge   CRANIAL NERVES: CN II: Visual fields are full to confrontation. Pupils are round equal and briskly reactive to light. CN III, IV, VI: extraocular movement are normal. No ptosis. CN V: Facial sensation is intact to light touch CN VII: Face is symmetric with normal eye closure  CN VIII: Hearing is normal to causal conversation. CN IX, X: Phonation is normal. CN XI: Head turning and shoulder shrug are intact  MOTOR: Mild fixation of left upper extremity upon rapid rotating movement, mild spasticity of left lower extremity, left hip flexion 4 knee extension 4+, knee flexion 5, ankle dorsiflexion 4, ankle plantarflexion 4  REFLEXES: Upper extremity reflexes were symmetric, left knee reflexes were 3, right was 2, left side Babinski sign,  SENSORY: Intact to light touch, pinprick and vibratory sensation are intact in fingers and toes.  COORDINATION: There is no trunk or limb dysmetria noted.  GAIT/STANCE: She can get up from seated position arms crossed, dragging left leg across the floor, could not perform left heel walking or tiptoe walking Romberg is absent.   DIAGNOSTIC DATA (LABS, IMAGING, TESTING) - I reviewed patient records, labs, notes, testing and imaging myself where available.   ASSESSMENT AND PLAN  Jessica Hernandez is a 44 y.o. female   Relapsing Remitting Multiple Sclerosis  Confirm by her clinical history, physical exam, abnormal MRI of the thoracic, and brain, spinal fluid testing showed more than 5 oligoclonal banding,  We went through information from [place] website,  offered her treatment option, she want to proceed with Tysabri infusion  More laboratory evaluation including JC virus titer, signed the paperwork today   Right hip pain, gait abnormality  X-ray of right hip,  She is going through physical therapy,  Emphasized importance of moderate exercise,   Marcial Pacas, M.D. Ph.D.  [facility] [address] [place], [place] [address] Ph: [phone] Fax: [phone]  CC: Referring Provider

## 2019-07-29 ENCOUNTER — Telehealth: Payer: Self-pay | Admitting: Neurology

## 2019-07-29 LAB — QUANTIFERON-TB GOLD PLUS
QuantiFERON Mitogen Value: >10 IU/mL
QuantiFERON Nil Value: 0 IU/mL
QuantiFERON TB1 Ag Value: 0 IU/mL
QuantiFERON TB2 Ag Value: 0 IU/mL
QuantiFERON-TB Gold Plus: NEGATIVE

## 2019-07-29 LAB — HEPATITIS B SURFACE ANTIBODY,QUALITATIVE: Hep B Surface Ab, Qual: REACTIVE

## 2019-07-29 LAB — VARICELLA ZOSTER ANTIBODY, IGG: Varicella zoster IgG: 2797 index (ref >165)

## 2019-07-29 LAB — HEPATITIS B SURFACE ANTIGEN: Hepatitis B Surface Ag: NEGATIVE

## 2019-07-29 LAB — STRATIFY JCV(TM) AB W/INDEX
JCV Antibody: POSITIVE — AB
JCV Index Value: 3.72

## 2019-07-29 LAB — NEUROMYELITIS OPTICA AUTOAB, IGG: NMO IgG Autoantibodies: <1.5 U/mL (ref 0.0–3.0)

## 2019-07-29 LAB — HEPATITIS B CORE ANTIBODY, TOTAL: Hep B Core Total Ab: NEGATIVE

## 2019-07-29 NOTE — Telephone Encounter (Signed)
Please call patient, JC virus titer was significantly elevated at 3.72, during last visit on Jul 23, 2019, she preferred Tysabri treatment, with her high titer JC virus, is no longer a good option, she is a Designer, jewellery, has good understanding of the MS medication of choice, the other medicine I would suggest to be ocrelizumab,

## 2019-07-29 NOTE — Telephone Encounter (Addendum)
I spoke to the patient. She verbalized understanding of her lab results. She would like to proceed with Ocrevus. She will come by our office to sign the start form on 07/31/19 then it will be provided to [facility]. She is aware to expect a call from them to get her IV scheduled, once it is approved by her insurance company.

## 2019-07-29 NOTE — Telephone Encounter (Signed)
Left message requesting a return call.

## 2019-07-30 ENCOUNTER — Ambulatory Visit: Payer: No Typology Code available for payment source | Attending: Neurology

## 2019-07-30 DIAGNOSIS — M6281 Muscle weakness (generalized): Secondary | ICD-10-CM | POA: Insufficient documentation

## 2019-07-30 DIAGNOSIS — R2689 Other abnormalities of gait and mobility: Secondary | ICD-10-CM | POA: Insufficient documentation

## 2019-07-31 NOTE — Telephone Encounter (Signed)
I called the patient. She was not able to come to the office today. She plans to come on Monday, 08/04/19, to sign the start form. It is up front for her.

## 2019-08-04 ENCOUNTER — Other Ambulatory Visit: Payer: Self-pay

## 2019-08-04 ENCOUNTER — Ambulatory Visit: Payer: No Typology Code available for payment source

## 2019-08-04 DIAGNOSIS — R2689 Other abnormalities of gait and mobility: Secondary | ICD-10-CM | POA: Diagnosis not present

## 2019-08-04 DIAGNOSIS — M6281 Muscle weakness (generalized): Secondary | ICD-10-CM | POA: Diagnosis present

## 2019-08-04 NOTE — Therapy (Signed)
[facility] [address] [place], [place], [address] Phone: [phone]   Fax:  [phone]  Physical Therapy Treatment  Patient Details  Name: Jessica Hernandez MRN: [id number] Date of Birth: Feb 15, 1976 Referring Provider (PT): Marcial Pacas   Encounter Date: 08/04/2019  PT End of Session - 08/04/19 0726    Visit Number  9    Number of Visits  12    Date for PT Re-Evaluation  08/17/19    PT Start Time  0722   pt arrived late   PT Stop Time  0801    PT Time Calculation (min)  39 min    Activity Tolerance  Patient tolerated treatment well    Behavior During Therapy  [facility] for tasks assessed/performed       Past Medical History:  Diagnosis Date  . Back pain   . Diabetes ([place])   . Hypertension   . Left leg weakness   . Obesity   . Right leg pain     Past Surgical History:  Procedure Laterality Date  . CHOLECYSTECTOMY    . FOOT SURGERY     age 44    There were no vitals filed for this visit.  Subjective Assessment - 08/04/19 0724    Subjective  Pt reports that she saw Dr. Krista Blue and they discussed her MS. They went over her MRI and noted lesions on thoracic spine. Have decided on Ocravis. She still has to sign the paperwork and get approval. Hopefully starting soon with infusions. Pain is a little better but worse with sit to stand. Off Wed 6/16, Monday 6/21 if want to try to get in pool    Diagnostic tests  MRI of brain, multiple T2/flair hyperintensity within the deep periventricular white multiple T2/flair hyperintensity lesion within the deep periventricular white matter, differentiation diagnosis including multiple sclerosis versus small vessel diseaseMRI of the cervical spine no intrinsic cord lesion, no significant degenerative disease MRI of lumbar, moderate facet hypertrophy at L4-5, with prominent infusion in the joints bilaterally, no significant canal or foraminal narrowing    Patient Stated Goals  Pt would like  to be able to climb stairs without rails and be able to donn socks/shoes/wear wedge shoe.    Currently in Pain?  Yes    Pain Score  0-No pain    Pain Location  Hip    Pain Orientation  Right    Pain Descriptors / Indicators  Aching    Pain Type  Chronic pain    Pain Onset  More than a month ago    Pain Frequency  Intermittent    Aggravating Factors   sit to stand    Pain Relieving Factors  sitting                        [facility] Adult PT Treatment/Exercise - 08/04/19 0729      Ambulation/Gait   Ambulation/Gait  Yes    Ambulation/Gait Assistance  5: Supervision    Ambulation/Gait Assistance Details  in clnic for activities    Assistive device  None    Gait Pattern  Step-through pattern;Decreased stance time - left;Decreased hip/knee flexion - left      Neuro Re-ed    Neuro Re-ed Details   Along counter: Side stepping along counter without UE support 6' x 6, side steping over 4 hurdles with verbal cues to bring leg up and over. Pt had some Inv at left ankle with going right as  well as some increased right hip pain. Reciprocal steps over 4 hurdles with finger tip support on 1 hand on counter 6' x 6. Backwards walking along counter 6' x 6. Standing on rockerboard at // bars: positioned ant/post trying to rock board x 10 then with maintaining level 30 sec x 2. Board turned lateral with trying to maintain level 30 sec x 2. Pt challenged more in lateral position as right leg tries to do more of the work then lateral rocking with light UE support x 10. Close SBA for safety. Pt took a couple seated rest breaks a couple times during session to rest right hip/back. Pt feels better in flexed position.      Exercises   Exercises  Other Exercises      Knee/Hip Exercises: Aerobic   Other Aerobic  Sci-Fit level 4 x 6 min. Pt reported some pain in right hip towards end of time.             PT Education - 08/04/19 0816    Education Details  Pt to continue with current HEP. PT will  see if able to schedule pt in pool on 1-2 of her upcoming days off.    Person(s) Educated  Patient    Methods  Explanation    Comprehension  Verbalized understanding       PT Short Term Goals - 07/15/19 1044      PT SHORT TERM GOAL #1   Title  Pt will be independent in initial HEP for strength and balance.    Baseline  Pt reports performing initial HEP.    Time  4    Period  Weeks    Status  Achieved    Target Date  07/18/19      PT SHORT TERM GOAL #2   Title  Pt will be instructed on pain management techniques for right hip area to verbalize understanding.    Baseline  Pt has been instructed in positioning and exercises/stretches to help with pain in right hip area and verbalizes understanding.    Time  4    Period  Weeks    Status  Achieved    Target Date  07/18/19      PT SHORT TERM GOAL #3   Title  Pt will increase 30 sec sit to stand reps from 10 to 12 or more for improved functional strength.    Baseline  10 reps from chair with UE support. 9 reps on 07/15/19 but was on track to beat having to stop for a few seconds due to increased pain. Pt was able to perform without UE support today.    Time  4    Period  Weeks    Status  Not Met    Target Date  07/18/19        PT Long Term Goals - 06/18/19 1854      PT LONG TERM GOAL #1   Title  Pt will be independent with progressive HEP for strengthening and balance to continue gains on own.    Time  8    Period  Weeks    Status  New    Target Date  08/17/19      PT LONG TERM GOAL #2   Title  Pt will increase gait speed from 0.61ms to >1.186m for improved gait safety.    Baseline  0.9778m   Time  8    Period  Weeks    Status  New  Target Date  08/17/19      PT LONG TERM GOAL #3   Title  Pt will increase FGA from 17/30 to >21/30 for improved balance and gait safety.    Baseline  17/30    Time  8    Period  Weeks    Status  New    Target Date  08/17/19      PT LONG TERM GOAL #4   Title  Pt will ambulate >500'  on varied surfaces independently for improved community mobility.    Time  8    Period  Weeks    Status  New    Target Date  08/17/19      PT LONG TERM GOAL #5   Title  Pt will be able to ascend/descend steps in reciprocal pattern with 1 rail mod I for improved functional strength and mobility.    Time  8    Period  Weeks    Status  New    Target Date  08/17/19            Plan - 08/04/19 0817    Clinical Impression Statement  Pt had intermittent increases in right hip pain with standing activities having to rest a few seconds but overall did well with standing strengthening and balance. PT tried to avoid a lot of static activities that would involve right SLS to help as well.    Examination-Activity Limitations  Locomotion Level;Stairs    Examination-Participation Restrictions  Community Activity    Stability/Clinical Decision Making  Evolving/Moderate complexity    Rehab Potential  Good    PT Frequency  2x / week   followed by 1x/week for 5 weeks   PT Duration  3 weeks    PT Treatment/Interventions  ADLs/Self Care Home Management;Aquatic Therapy;Cryotherapy;Functional mobility training;Stair training;Gait training;DME Instruction;Therapeutic activities;Therapeutic exercise;Balance training;Neuromuscular re-education;Manual techniques;Vestibular;Patient/family education;Passive range of motion    PT Next Visit Plan  Are we able to get in pool? Continue with aerobic activity with SciFit.  Manual techniques for right hip/low back pain. Continue with hip strengthening and LLE strengthening. Gait training and balance.    Consulted and Agree with Plan of Care  Patient       Patient will benefit from skilled therapeutic intervention in order to improve the following deficits and impairments:  Abnormal gait, Decreased balance, Decreased range of motion, Decreased strength, Decreased mobility, Pain, Increased edema  Visit Diagnosis: Other abnormalities of gait and mobility  Muscle  weakness (generalized)     Problem List Patient Active Problem List   Diagnosis Date Noted  . Relapsing remitting multiple sclerosis ([place]) 07/23/2019  . Gait abnormality 04/23/2019  . Left leg weakness 04/23/2019  . Abnormal brain MRI 04/23/2019    Electa Sniff, PT, DPT, NCS 08/04/2019, 8:21 AM  [facility] [address] [place], [place], [address] Phone: [phone]   Fax:  [phone]  Name: Jessica Hernandez MRN: [id number] Date of Birth: [birth year]

## 2019-08-04 NOTE — Telephone Encounter (Signed)
Pt has signed the start form. It has been faxed to [facility] and provided to [facility].

## 2019-08-08 LAB — MULTIPLE SCLEROSIS PANEL 2
Albumin Serum: 4 g/dL (ref 3.5–5.2)
Albumin, CSF: 16.9 mg/dL (ref 8.0–42.0)
CNS-IgG Synthesis Rate: 2.2 mg/24 h (ref <=3.3)
IgG (Immunoglobin G), Serum: 1230 mg/dL (ref 600–1640)
IgG Total CSF: 3.7 mg/dL (ref 0.8–7.7)
IgG-Index: 0.71 — ABNORMAL HIGH (ref <0.66)
Myelin Basic Protein: <2 mcg/L (ref 2.0–4.0)

## 2019-08-08 LAB — CSF CELL COUNT WITH DIFFERENTIAL
RBC Count, CSF: 17 cells/uL — ABNORMAL HIGH
WBC, CSF: 1 cells/uL (ref 0–5)

## 2019-08-08 LAB — GRAM STAIN
MICRO NUMBER:: 10473930
SPECIMEN QUALITY:: ADEQUATE

## 2019-08-08 LAB — FUNGUS CULTURE W SMEAR
CULTURE:: NO GROWTH
MICRO NUMBER:: 10473931
SMEAR:: NONE SEEN
SPECIMEN QUALITY:: ADEQUATE

## 2019-08-08 LAB — VDRL, CSF: VDRL Quant, CSF: NONREACTIVE

## 2019-08-08 LAB — PROTEIN, CSF: Total Protein, CSF: 41 mg/dL (ref 15–45)

## 2019-08-08 LAB — GLUCOSE, CSF: Glucose, CSF: 121 mg/dL — ABNORMAL HIGH (ref 40–80)

## 2019-08-11 ENCOUNTER — Telehealth: Payer: Self-pay | Admitting: Neurology

## 2019-08-11 ENCOUNTER — Ambulatory Visit: Payer: No Typology Code available for payment source

## 2019-08-11 NOTE — Telephone Encounter (Signed)
I have called and failed to reach patient, her jc virus titer was high 3.72.  She is not a good candidate for Tysarbri infusion.  She works as Charity fundraiser, has good understanding of MS treatment options, the other option are ocrelizumab, Lemtrada, Mavenclad  Please ask her preference, I can call her if need to, and to finish the paperwork

## 2019-08-18 ENCOUNTER — Ambulatory Visit: Payer: No Typology Code available for payment source

## 2019-08-18 ENCOUNTER — Telehealth: Payer: Self-pay

## 2019-08-18 NOTE — Telephone Encounter (Signed)
PT called to check on patient after no shows 2 weeks in a row. Left message for patient to call back as those were last 2 visits scheduled to see how she would like to proceed.  Elmer Bales, PT, DPT, NCS

## 2019-09-11 NOTE — Telephone Encounter (Signed)
Per Freddi Starr, RN pt still needs to reach out to 800 number for apply for pt assistance. She has about 5500 OOP cost for Ocrevus. I called pt and she states she already took care of this two weeks ago and spoke with infusion suite last week. States she is scheduled this upcoming Monday for infusion. I placed her on hold. Spoke with Freddi Starr, RN. Liane states that on their end, it appears it has not been resolved and pt needs to call pt service department back. I transferred call to [facility] for her to speak with pt.

## 2019-10-30 ENCOUNTER — Ambulatory Visit (INDEPENDENT_AMBULATORY_CARE_PROVIDER_SITE_OTHER): Payer: No Typology Code available for payment source | Admitting: Neurology

## 2019-10-30 ENCOUNTER — Encounter: Payer: Self-pay | Admitting: Neurology

## 2019-10-30 ENCOUNTER — Other Ambulatory Visit: Payer: Self-pay

## 2019-10-30 VITALS — BP 155/96 | HR 106 | Ht 68.0 in | Wt 318.0 lb

## 2019-10-30 DIAGNOSIS — G35 Multiple sclerosis: Secondary | ICD-10-CM

## 2019-10-30 DIAGNOSIS — R269 Unspecified abnormalities of gait and mobility: Secondary | ICD-10-CM

## 2019-10-30 MED ORDER — TIZANIDINE HCL 4 MG PO TABS: 4.0000 mg | ORAL_TABLET | Freq: Three times a day (TID) | ORAL | 3 refills | Status: DC | PRN

## 2019-10-30 NOTE — Patient Instructions (Addendum)
Stop the Flexeril, switch to tizanidine 4 mg 3 times daily as needed Go to [facility] to get your xray Try to increase, try to do some exercise  See you back in 4 months

## 2019-10-30 NOTE — Progress Notes (Signed)
PATIENT: Jessica Hernandez DOB: 02-27-1976  REASON FOR VISIT: follow up HISTORY FROM: patient  HISTORY OF PRESENT ILLNESS: Today 10/30/19  HISTORY Jessica Hernandez is a 44 year old female, seen in request by neurosurgeon Dr. Christella Noa, and primary care physician Dr. Susa Day for evaluation of gait abnormality, new onset paresthesia, and abnormal MRI scans, initial evaluation was on April 23, 2019.  I have reviewed and summarized the referring note from the referring physician.  She had a past medical history of insulin-dependent diabetes, obesity, hypertension,  Around 2015, she noticed gradual onset left leg difficulty, she has to use her hands to lift up her left leg when step into the car, rely on the handrail to go up stairs, she denies significant pain, or sensory loss, she was seen by outside neurologist, had MRI of lumbar spine with no clear etiology was found, her left leg weakness has been persistent over the years  January 2020, she began to develop low back pain, radiating pain to right lower extremity, along the right lateral thigh, anterior shin, bottom of her right foot, also complains of slow worsening urinary urgency,  Since September 2020, she noticed left hand weakness, she has difficulty opening bottles with her left hand, denies pain, or sensory loss  She has been followed up by her optometrist on a yearly basis, there was no visual loss  Since January 2021, she noticed numbness tingling heating sensation at the anterior abdomen region, radiating aiding along left lower thoracic region,  I personally reviewed MRIs in January 2021,  MRI of brain, multiple T2/flair hyperintensity within the deep periventricular white multiple T2/flair hyperintensity lesion within the deep periventricular white matter, differentiation diagnosis including multiple sclerosis versus small vessel disease  MRI of the cervical spine no intrinsic cord lesion, no significant degenerative  disease  MRI of lumbar, moderate facet hypertrophy at L4-5, with prominent infusion in the joints bilaterally, no significant canal or foraminal narrowing  Laboratory evaluation November 2020, CMP elevated glucose 141, creatinine 0.67 negative ANA,  UPDATE Jul 23 2019: She came in today to discuss treatment option for her relapsing remitting multiple sclerosis, which has been confirmed by her history, abnormal physical findings and abnormal MRI of thoracic, brain,  We personally reviewed MRI of thoracic spine with without contrast in April 2021, T2 hyperintensity focus in the left lateral spinal cord adjacent to T5, no contrast-enhancement  MRI of cervical spine, no contrast-enhancement, no intrinsic cord lesion  MRI of the brain only showed mild periventricular deep white matter changes, no contrast-enhancement  Reviewed laboratory evaluations,  spinal fluid testing on Jul 10, 2019 showed more than 5 oligoclonal banding, consistent diagnosis of multiple sclerosis, WBC was 1, total protein was 41 Decreased vitamin D 11, she is on vitamin D supplement, Rest of the laboratory evaluation showed normal or negative Lyme titer, ACE, ANA, copper, ESR, C-reactive protein, TSH, folic acid, K02, HIV, RPR  She complains of gait abnormality, fatigue, lower extremity swelling, significant right hip pain   Update November 13, 2019 SS: Positive JCV titer 3.72.  She has been started on Ocrevus, her second initial dose was August 2.  Went to the ER August 8, complaining of fever and tachycardia. No etiology was determined, possibly related to Cymbalta?  Continues with same complaints, weakness to the left leg and swelling, significant pain to the right hip/buttocks, radiating down right leg to heel.  Is on Flexeril, gabapentin, Cymbalta 60 mg daily (cut out the 30 mg additional).  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  No falls. B & B are okay.  Never went to get x-ray of right hip/pelvis. Reportedly, saw  Dr. Christella Noa before referred her, had nerve conduction of right leg, was reportedly normal.  Is rather sedentary, tried physical therapy, too painful, lack of benefit.  REVIEW OF SYSTEMS: Out of a complete 14 system review of symptoms, the patient complains only of the following symptoms, and all other reviewed systems are negative.   See HPI  ALLERGIES: Allergies  Allergen Reactions  . Clarithromycin Other (See Comments)    Other reaction(s): trouble swallowing    HOME MEDICATIONS: Outpatient Medications Prior to Visit  Medication Sig Dispense Refill  . acetaminophen (TYLENOL) 500 MG tablet Take 2 tablets by mouth daily as needed.     . ASPIRIN 81 PO Take 81 mg by mouth daily.     . Cholecalciferol (VITAMIN D3 PO) Take 2,000 Units by mouth daily.    . Cyanocobalamin (VITAMIN B-12 IJ) Inject 1,000 mcg as directed once a week.    . cyclobenzaprine (FLEXERIL) 10 MG tablet Take 10 mg by mouth daily as needed for muscle spasms.    . diphenhydrAMINE (BENADRYL) 50 MG tablet Take 50 mg by mouth at bedtime as needed for itching.    . DULoxetine (CYMBALTA) 30 MG capsule Take 30 mg by mouth at bedtime.    . DULoxetine (CYMBALTA) 60 MG capsule Take 60 mg by mouth daily.    Marland Kitchen gabapentin (NEURONTIN) 300 MG capsule Take 300 mg by mouth 3 (three) times daily.    . insulin detemir (LEVEMIR) 100 UNIT/ML injection Inject 30 Units into the skin 2 (two) times daily.     Marland Kitchen lisinopril-hydrochlorothiazide (ZESTORETIC) 10-12.5 MG tablet Take 1 tablet by mouth daily.    . metFORMIN (GLUCOPHAGE) 1000 MG tablet Take 1,000 mg by mouth 2 (two) times daily with a meal.     . ocrelizumab (OCREVUS) 300 MG/10ML injection Inject 600 mg into the vein. 373m IV on day 1 & 15. 606mIV q 6 months thereafter. [facility].    . Semaglutide (OZEMPIC, 0.25 OR 0.5 MG/DOSE, [place]) Inject 0.25 mg into the skin once a week.    . Marland KitchenANUVIA 100 MG tablet Take 100 mg by mouth daily.     No facility-administered medications prior to  visit.    PAST MEDICAL HISTORY: Past Medical History:  Diagnosis Date  . Back pain   . [place]  . Hypertension   . Left leg weakness   . Obesity   . Right leg pain     PAST SURGICAL HISTORY: Past Surgical History:  Procedure Laterality Date  . CHOLECYSTECTOMY    . FOOT SURGERY     age 44  FAMILY HISTORY: Family History  Problem Relation Age of Onset  . Multiple sclerosis Sister   . Diabetes Mother   . Thyroid cancer Mother   . Hypertension Mother   . Rheum arthritis Mother   . Hypertension Father   . Prostate cancer Father   . Transient ischemic attack Father   . Healthy Brother   . Diabetes Maternal Grandmother   . Other Maternal Grandfather        unsure of medical history  . Other Paternal Grandmother        unsure of medical history  . Other Paternal Grandfather        unsure of medical history  . Healthy Sister     SOCIAL HISTORY: Social History   Socioeconomic History  .  Marital status: Single    Spouse name: Not on file  . Number of children: 1  . Years of education: college  . Highest education level: Not on file  Occupational History  . Occupation: Therapist, sports - Nutritional therapist of diseases  Tobacco Use  . Smoking status: Never Smoker  . Smokeless tobacco: Never Used  Substance and Sexual Activity  . Alcohol use: Yes    Comment: occasional wine  . Drug use: Never  . Sexual activity: Not on file  Other Topics Concern  . Not on file  Social History Narrative   Lives at home with her daughter.   Right-handed.   Caffeine use: 2 cups every other day.   Social Determinants of Health   Financial Resource Strain:   . Difficulty of Paying Living Expenses: Not on file  Food Insecurity:   . Worried About Charity fundraiser in the Last Year: Not on file  . Ran Out of Food in the Last Year: Not on file  Transportation Needs:   . Lack of Transportation (Medical): Not on file  . Lack of Transportation (Non-Medical): Not on file  Physical  Activity:   . Days of Exercise per Week: Not on file  . Minutes of Exercise per Session: Not on file  Stress:   . Feeling of Stress : Not on file  Social Connections:   . Frequency of Communication with Friends and Family: Not on file  . Frequency of Social Gatherings with Friends and Family: Not on file  . Attends Religious Services: Not on file  . Active Member of Clubs or Organizations: Not on file  . Attends Archivist Meetings: Not on file  . Marital Status: Not on file  Intimate Partner Violence:   . Fear of Current or Ex-Partner: Not on file  . Emotionally Abused: Not on file  . Physically Abused: Not on file  . Sexually Abused: Not on file   PHYSICAL EXAM  Vitals:   10/30/19 1603  BP: (!) 155/96  Pulse: (!) 106  Weight: (!) 318 lb (144.2 kg)  Height: '5\' 8"'  (1.727 m)   Body mass index is 48.35 kg/m. Generalized: Well developed, in no acute distress  Neurological examination  Mentation: Alert oriented to time, place, history taking. Follows all commands speech and language fluent Cranial nerve II-XII: Pupils were equal round reactive to light. Extraocular movements were full, visual field were full on confrontational test. Facial sensation and strength were normal. Head turning and shoulder shrug were normal and symmetric. Motor: Strength in upper extremities is intact, 4/5 right and left hip flexion, edema to left lower extremity Sensory: Sensory testing is intact to soft touch on all 4 extremities. No evidence of extinction is noted.  Coordination: Cerebellar testing reveals good finger-nose-finger bilaterally, difficulty performing heel-to-shin Gait and station: Noted to somewhat drag left leg with walking Reflexes: Deep tendon reflexes are symmetric but left knee was 3+, right was 2+  DIAGNOSTIC DATA (LABS, IMAGING, TESTING) - I reviewed patient records, labs, notes, testing and imaging myself where available.  No results found for: WBC, HGB, HCT, MCV,  PLT    Component Value Date/Time   ALBUMIN 4.0 07/10/2019 0826   No results found for: CHOL, HDL, LDLCALC, LDLDIRECT, TRIG, CHOLHDL No results found for: HGBA1C Lab Results  Component Value Date   VITAMINB12 344 04/23/2019   Lab Results  Component Value Date   TSH 2.650 04/23/2019      ASSESSMENT AND PLAN 44 y.o. year  old female  has a past medical history of Back pain, Diabetes ([place]), Hypertension, Left leg weakness, Obesity, and Right leg pain. here with:  1.  Relapsing remitting multiple sclerosis -Confirmed by clinical history, physical exam, abnormal MRI of thoracic spine and brain, spinal fluid testing showed more than 5 oligoclonal banding -Positive JCV titer 3.72 -On Ocrevus, initial dose in July 2021, split 2nd dose September 29, 2019  2.  Right hip pain, gait abnormality -Never went for x-ray of right hip, order still good at [place], she will get this done -Apparently, had nerve conduction prior of the right leg that was normal -Encouraged her to increase activity, exercise -Stop Flexeril, switch to tizanidine 4 mg up to 3 times daily as needed -Is also taking Cymbalta and gabapentin from PCP -Follow-up in 4 months or sooner if needed  I spent 30 minutes of face-to-face and non-face-to-face time with patient.  This included previsit chart review, lab review, study review, order entry, electronic health record documentation, patient education.  Butler Denmark, AGNP-C, DNP 10/30/2019, 4:06 PM [facility] [address], [place], [place] [address] [phone]

## 2019-12-22 ENCOUNTER — Telehealth: Payer: Self-pay | Admitting: Neurology

## 2019-12-22 NOTE — Telephone Encounter (Signed)
Pt called, having nerve like pain from MS. Can you call me in some ointment or cream. Can not use Diclofenac, Would like a call from the nurse. 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

## 2019-12-23 NOTE — Telephone Encounter (Signed)
I called pt and relayed the recommendations to the her.   Nothing more to offer her , other then increase gabapentin per pcp.  Really recommend to get R hip xray.  Pt aware.  Appreciated call back.

## 2019-12-23 NOTE — Telephone Encounter (Signed)
Reviewed chart, she is already taking Cymbalta, gabapentin, I dher to tizanidine from Flexeril to help with the right hip pain.  She could go higher on the gabapentin, I see we are not prescribing this.  Needs to get x-ray.  Probably only taking Cymbalta 60 mg, went to the ER in August, fever tachycardia, possibly related to higher Cymbalta?

## 2019-12-23 NOTE — Telephone Encounter (Signed)
I called pt.  She states that she would like for [place] to give her something for the nerve pain that she is having (who she states her pcp is hit or miss and gives her cymbalta 30mg  po qhs and gabapentin 300mg  tid).  Her nerve pain is R leg back of thighs, leg and foot.  Level 10.  Did not want another tablet diclofenac not working.  Tizanidine used for spasms.  She has not had hip xray on R, (something has always come up, now her father died and plans for this are pending). She cannot have done now.  Please advise.

## 2019-12-30 NOTE — Progress Notes (Signed)
I have reviewed and agreed above plan. 

## 2019-12-31 ENCOUNTER — Other Ambulatory Visit: Payer: Self-pay

## 2019-12-31 ENCOUNTER — Ambulatory Visit
Admission: RE | Admit: 2019-12-31 | Discharge: 2019-12-31 | Disposition: A | Discharge status: Home / Self Care | Payer: No Typology Code available for payment source | Source: Ambulatory Visit | Attending: Neurology | Admitting: Neurology

## 2019-12-31 DIAGNOSIS — R269 Unspecified abnormalities of gait and mobility: Secondary | ICD-10-CM

## 2019-12-31 DIAGNOSIS — G35 Multiple sclerosis: Secondary | ICD-10-CM

## 2019-12-31 IMAGING — CR DG HIP (WITH OR WITHOUT PELVIS) 2-3V*R*
2 series · 2 of 2 positions shown · non-contrast
Comparison: None.

CLINICAL DATA: Right lateral hip pain, no known injury, initial
encounter

EXAM:
DG HIP (WITH OR WITHOUT PELVIS) 2V RIGHT

[w hip ap right]
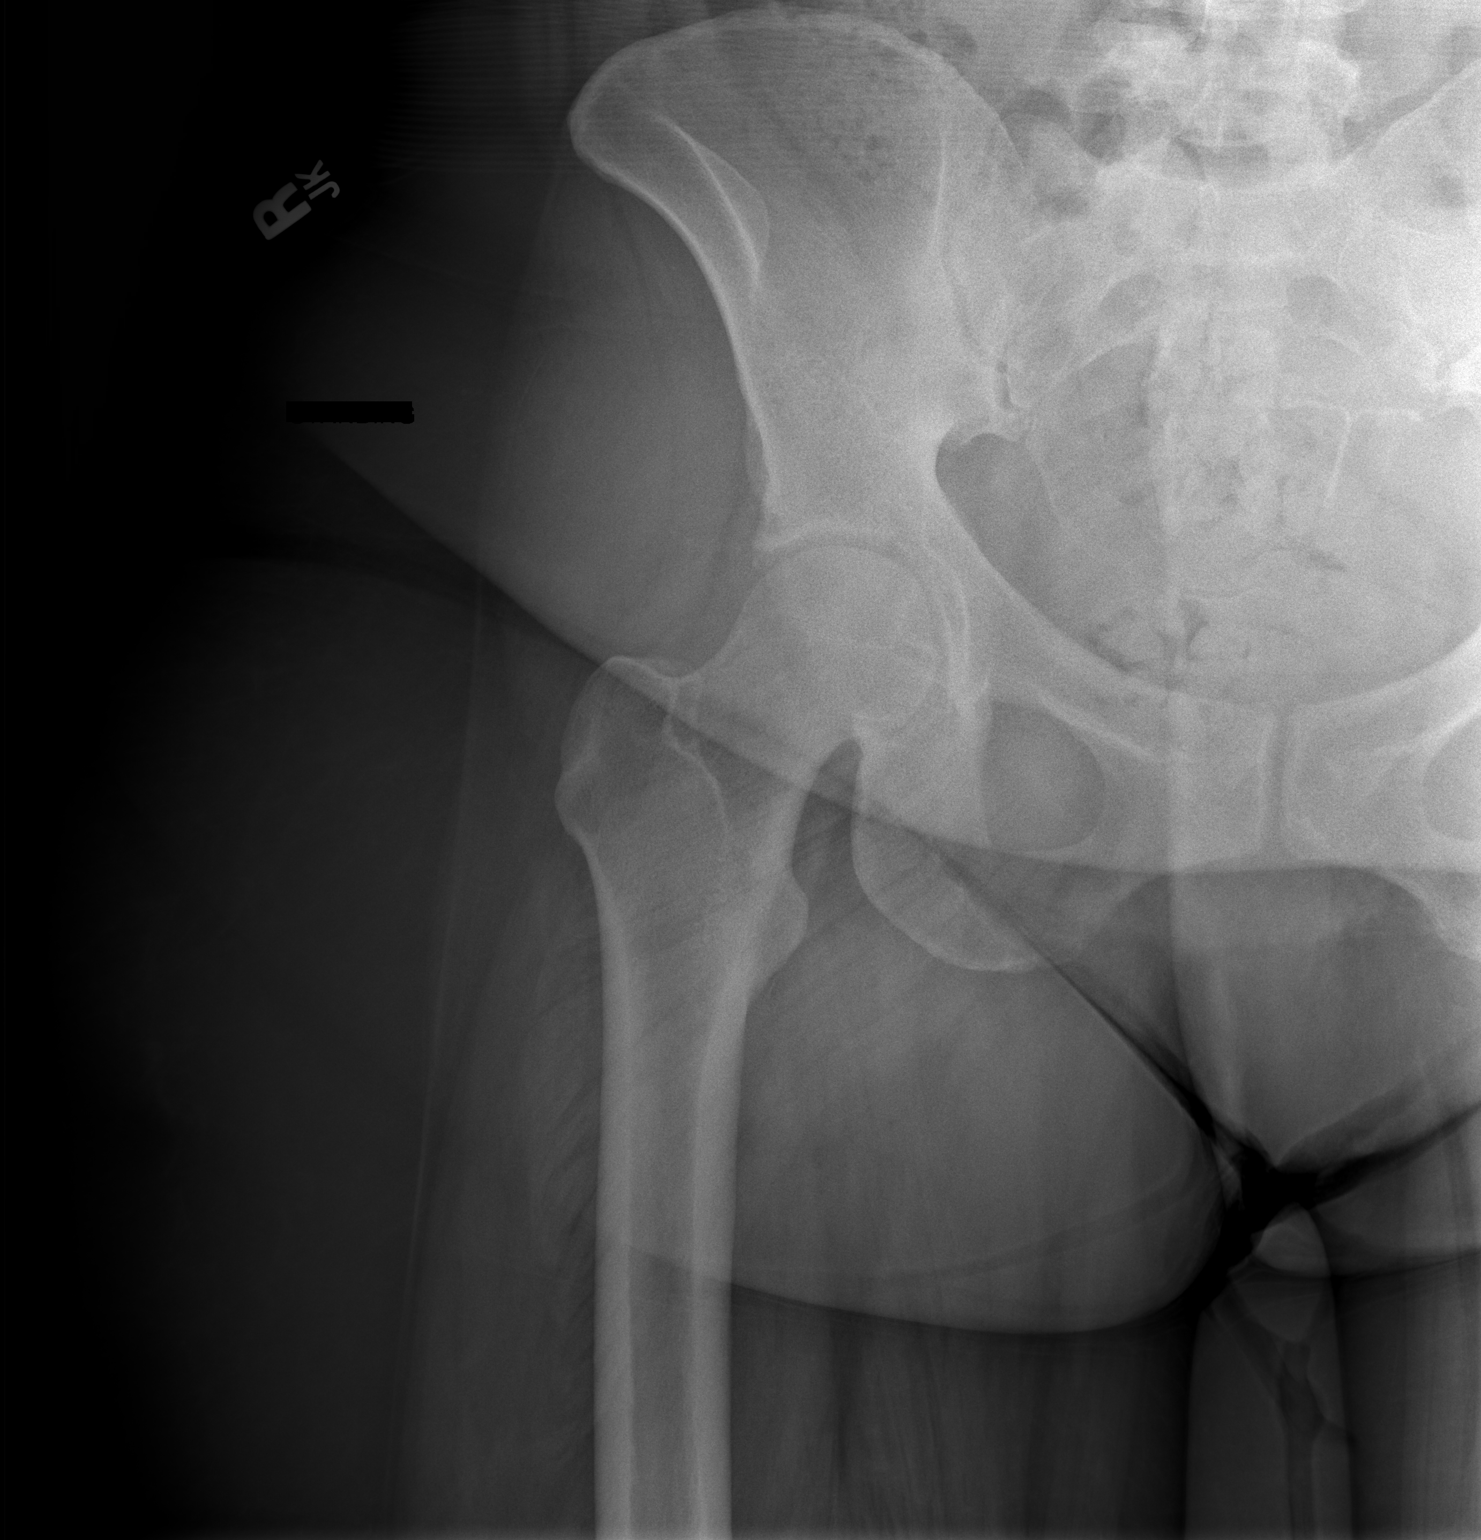

[w hip frog right]
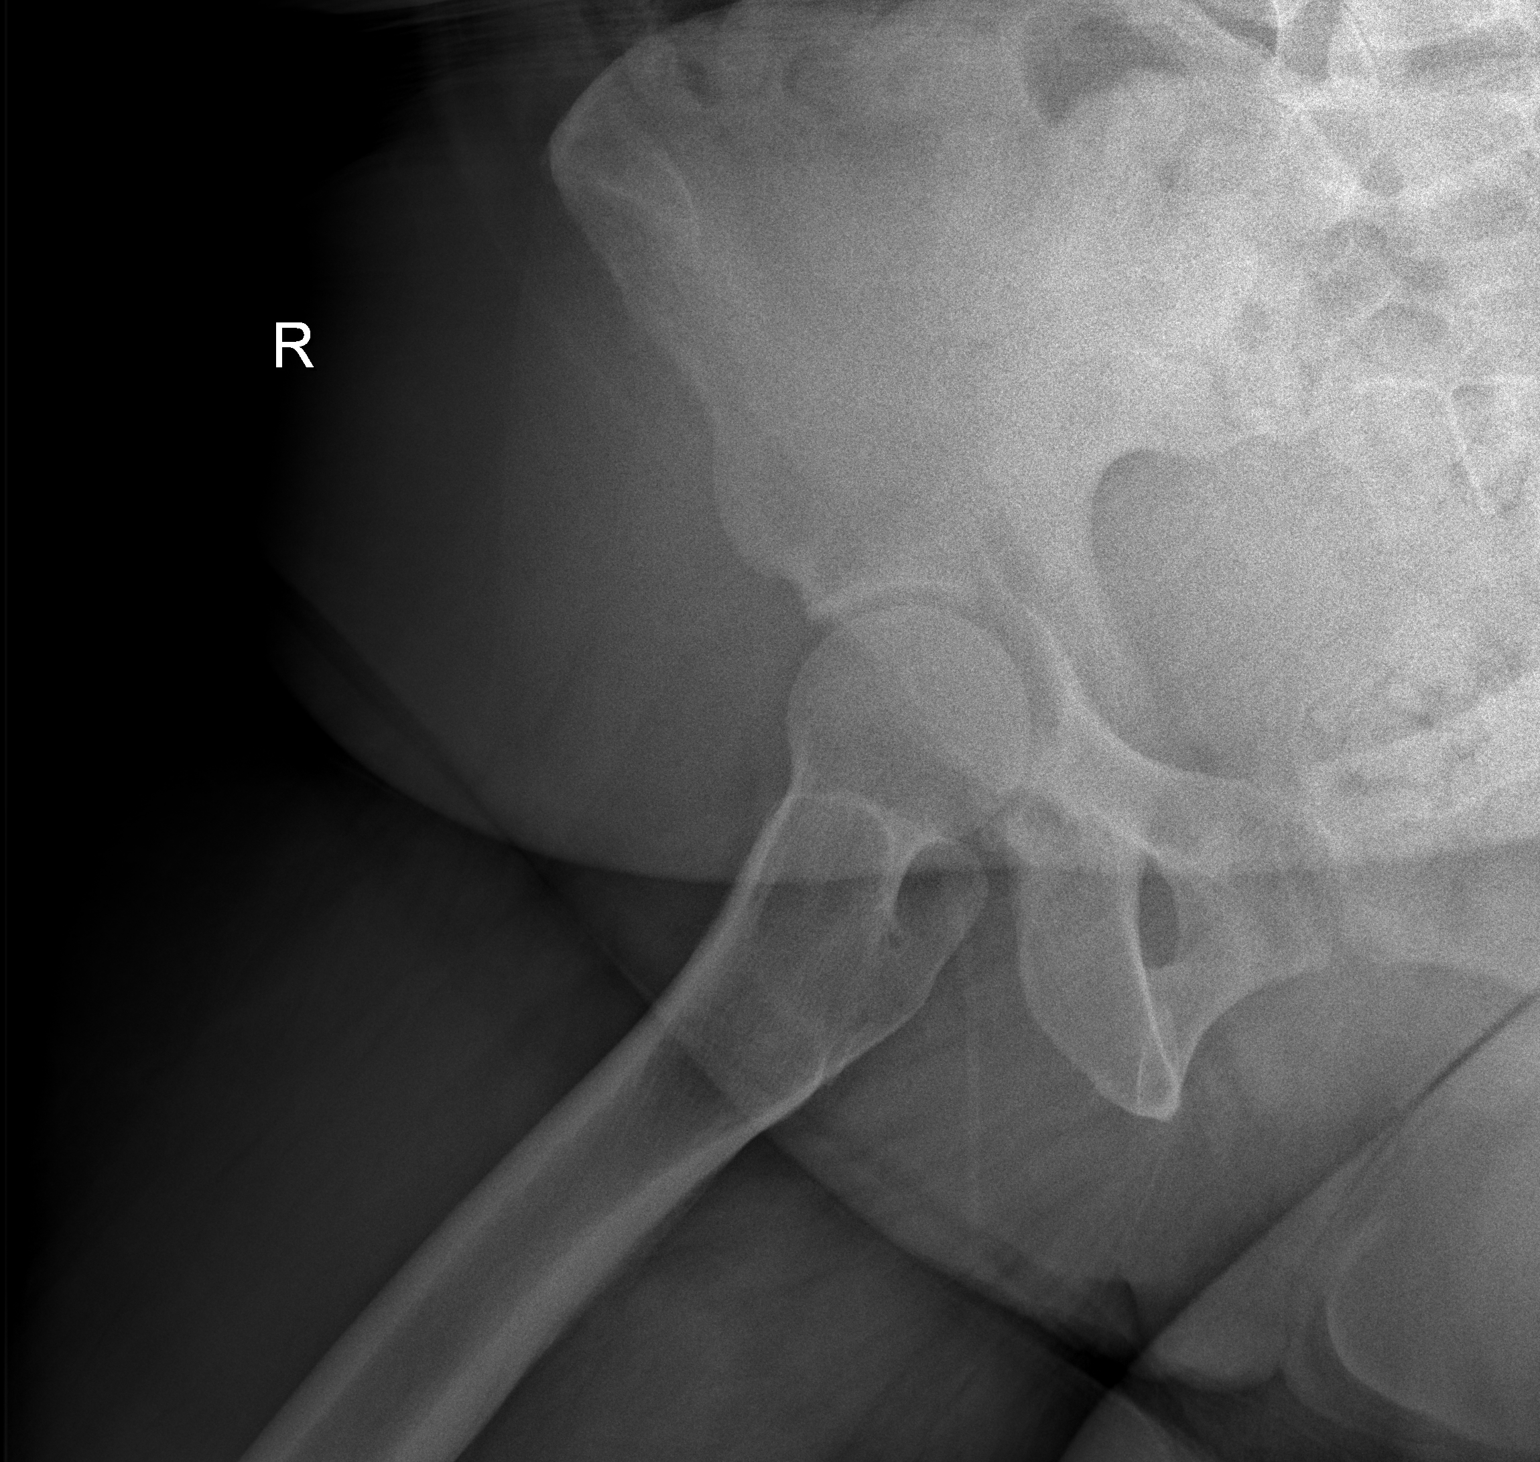

[2 of 2 positions shown; findings below may reference images not displayed]

FINDINGS: No acute fracture or dislocation is noted. Visualized pelvic ring is
intact. IUD is noted in place.
IMPRESSION: No acute abnormality noted.

## 2020-01-21 ENCOUNTER — Other Ambulatory Visit: Payer: Self-pay | Admitting: Neurology

## 2020-03-01 ENCOUNTER — Ambulatory Visit: Payer: No Typology Code available for payment source | Admitting: Neurology

## 2020-03-10 NOTE — Therapy (Signed)
[facility] [address] [place], [place], [address] Phone: [phone]   Fax:  [phone]  Patient Details  Name: Jessica Hernandez MRN: [id number] Date of Birth: 04/13/1975 Referring Provider:  No ref. provider found  Encounter Date: 03/10/2020  PHYSICAL THERAPY DISCHARGE SUMMARY/non visit d/c  Visits from Start of Care: 9  Current functional level related to goals / functional outcomes: Unknown as pt did not return after visit on 08/04/19.   Remaining deficits: unknown   Education / Equipment: HEP  Plan: Patient agrees to discharge.  Patient goals were not met. Patient is being discharged due to not returning since the last visit.  ?????          PT Short Term Goals - 07/15/19 1044      PT SHORT TERM GOAL #1   Title Pt will be independent in initial HEP for strength and balance.    Baseline Pt reports performing initial HEP.    Time 4    Period Weeks    Status Achieved    Target Date 07/18/19      PT SHORT TERM GOAL #2   Title Pt will be instructed on pain management techniques for right hip area to verbalize understanding.    Baseline Pt has been instructed in positioning and exercises/stretches to help with pain in right hip area and verbalizes understanding.    Time 4    Period Weeks    Status Achieved    Target Date 07/18/19      PT SHORT TERM GOAL #3   Title Pt will increase 30 sec sit to stand reps from 10 to 12 or more for improved functional strength.    Baseline 10 reps from chair with UE support. 9 reps on 07/15/19 but was on track to beat having to stop for a few seconds due to increased pain. Pt was able to perform without UE support today.    Time 4    Period Weeks    Status Not Met    Target Date 07/18/19           PT Long Term Goals - 06/18/19 1854      PT LONG TERM GOAL #1   Title Pt will be independent with progressive HEP for strengthening and balance to continue gains on own.     Time 8    Period Weeks    Status New    Target Date 08/17/19      PT LONG TERM GOAL #2   Title Pt will increase gait speed from 0.55ms to >1.124m for improved gait safety.    Baseline 0.9798m   Time 8    Period Weeks    Status New    Target Date 08/17/19      PT LONG TERM GOAL #3   Title Pt will increase FGA from 17/30 to >21/30 for improved balance and gait safety.    Baseline 17/30    Time 8    Period Weeks    Status New    Target Date 08/17/19      PT LONG TERM GOAL #4   Title Pt will ambulate >500' on varied surfaces independently for improved community mobility.    Time 8    Period Weeks    Status New    Target Date 08/17/19      PT LONG TERM GOAL #5   Title Pt will be able to ascend/descend steps in reciprocal pattern with 1 rail mod I  for improved functional strength and mobility.    Time 8    Period Weeks    Status New    Target Date 08/17/19           Electa Sniff, PT, DPT, NCS 03/10/2020, 11:31 AM  [facility] [address] [place], [place], [address] Phone: [phone]   Fax:  [phone]

## 2020-04-15 ENCOUNTER — Other Ambulatory Visit: Payer: Self-pay | Admitting: Neurology

## 2020-05-11 NOTE — Progress Notes (Deleted)
PATIENT: Jessica Hernandez DOB: 01-11-1976  REASON FOR VISIT: follow up HISTORY FROM: patient  HISTORY OF PRESENT ILLNESS: Today 05/11/20  HISTORY Jessica Hernandez is a 45 year old female, seen in request by neurosurgeon Dr. Christella Noa, and primary care physician Dr. Susa Day for evaluation of gait abnormality, new onset paresthesia, and abnormal MRI scans, initial evaluation was on April 23, 2019.  I have reviewed and summarized the referring note from the referring physician.  She had a past medical history of insulin-dependent diabetes, obesity, hypertension,  Around 2015, she noticed gradual onset left leg difficulty, she has to use her hands to lift up her left leg when step into the car, rely on the handrail to go up stairs, she denies significant pain, or sensory loss, she was seen by outside neurologist, had MRI of lumbar spine with no clear etiology was found, her left leg weakness has been persistent over the years  January 2020, she began to develop low back pain, radiating pain to right lower extremity, along the right lateral thigh, anterior shin, bottom of her right foot, also complains of slow worsening urinary urgency,  Since September 2020, she noticed left hand weakness, she has difficulty opening bottles with her left hand, denies pain, or sensory loss  She has been followed up by her optometrist on a yearly basis, there was no visual loss  Since January 2021, she noticed numbness tingling heating sensation at the anterior abdomen region, radiating aiding along left lower thoracic region,  I personally reviewed MRIs in January 2021,  MRI of brain, multiple T2/flair hyperintensity within the deep periventricular white multiple T2/flair hyperintensity lesion within the deep periventricular white matter, differentiation diagnosis including multiple sclerosis versus small vessel disease  MRI of the cervical spine no intrinsic cord lesion, no significant degenerative  disease  MRI of lumbar, moderate facet hypertrophy at L4-5, with prominent infusion in the joints bilaterally, no significant canal or foraminal narrowing  Laboratory evaluation November 2020, CMP elevated glucose 141, creatinine 0.67 negative ANA,  UPDATE Jul 23 2019: She came in today to discuss treatment option for her relapsing remitting multiple sclerosis, which has been confirmed by her history, abnormal physical findings and abnormal MRI of thoracic, brain,  We personally reviewed MRI of thoracic spine with without contrast in April 2021, T2 hyperintensity focus in the left lateral spinal cord adjacent to T5, no contrast-enhancement  MRI of cervical spine, no contrast-enhancement, no intrinsic cord lesion  MRI of the brain only showed mild periventricular deep white matter changes, no contrast-enhancement  Reviewed laboratory evaluations,  spinal fluid testing on Jul 10, 2019 showed more than 5 oligoclonal banding, consistent diagnosis of multiple sclerosis, WBC was 1, total protein was 41 Decreased vitamin D 11, she is on vitamin D supplement, Rest of the laboratory evaluation showed normal or negative Lyme titer, ACE, ANA, copper, ESR, C-reactive protein, TSH, folic acid, F74, HIV, RPR  She complains of gait abnormality, fatigue, lower extremity swelling, significant right hip pain   Update November 13, 2019 SS: Positive JCV titer 3.72.  She has been started on Ocrevus, her second initial dose was August 2.  Went to the ER August 8, complaining of fever and tachycardia. No etiology was determined, possibly related to Cymbalta?  Continues with same complaints, weakness to the left leg and swelling, significant pain to the right hip/buttocks, radiating down right leg to heel.  Is on Flexeril, gabapentin, Cymbalta 60 mg daily (cut out the 30 mg additional).  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  No falls. B & B are okay.  Never went to get x-ray of right hip/pelvis. Reportedly, saw  Dr. Christella Noa before referred her, had nerve conduction of right leg, was reportedly normal.  Is rather sedentary, tried physical therapy, too painful, lack of benefit.  Update May 12, 2020 SS:   REVIEW OF SYSTEMS: Out of a complete 14 system review of symptoms, the patient complains only of the following symptoms, and all other reviewed systems are negative.   See HPI  ALLERGIES: Allergies  Allergen Reactions  . Clarithromycin Other (See Comments)    Other reaction(s): trouble swallowing    HOME MEDICATIONS: Outpatient Medications Prior to Visit  Medication Sig Dispense Refill  . acetaminophen (TYLENOL) 500 MG tablet Take 2 tablets by mouth daily as needed.     . ASPIRIN 81 PO Take 81 mg by mouth daily.     . Cholecalciferol (VITAMIN D3 PO) Take 2,000 Units by mouth daily.    . Cyanocobalamin (VITAMIN B-12 IJ) Inject 1,000 mcg as directed once a week.    . diphenhydrAMINE (BENADRYL) 50 MG tablet Take 50 mg by mouth at bedtime as needed for itching.    . DULoxetine (CYMBALTA) 30 MG capsule Take 30 mg by mouth at bedtime.    . DULoxetine (CYMBALTA) 60 MG capsule Take 60 mg by mouth daily.    Marland Kitchen gabapentin (NEURONTIN) 300 MG capsule Take 300 mg by mouth 3 (three) times daily.    . insulin detemir (LEVEMIR) 100 UNIT/ML injection Inject 30 Units into the skin 2 (two) times daily.     Marland Kitchen lisinopril-hydrochlorothiazide (ZESTORETIC) 10-12.5 MG tablet Take 1 tablet by mouth daily.    . metFORMIN (GLUCOPHAGE) 1000 MG tablet Take 1,000 mg by mouth 2 (two) times daily with a meal.     . ocrelizumab (OCREVUS) 300 MG/10ML injection Inject 600 mg into the vein. 375m IV on day 1 & 15. 6064mIV q 6 months thereafter. [facility].    . Semaglutide (OZEMPIC, 0.25 OR 0.5 MG/DOSE, [place]) Inject 0.25 mg into the skin once a week.    . Marland KitcheniZANidine (ZANAFLEX) 4 MG tablet TAKE 1 TABLET (4 MG TOTAL) BY MOUTH EVERY 8 (EIGHT) HOURS AS NEEDED FOR MUSCLE SPASMS 270 tablet 0   No facility-administered medications  prior to visit.    PAST MEDICAL HISTORY: Past Medical History:  Diagnosis Date  . Back pain   . [place]  . Hypertension   . Left leg weakness   . Obesity   . Right leg pain     PAST SURGICAL HISTORY: Past Surgical History:  Procedure Laterality Date  . CHOLECYSTECTOMY    . FOOT SURGERY     age 45  FAMILY HISTORY: Family History  Problem Relation Age of Onset  . Multiple sclerosis Sister   . Diabetes Mother   . Thyroid cancer Mother   . Hypertension Mother   . Rheum arthritis Mother   . Hypertension Father   . Prostate cancer Father   . Transient ischemic attack Father   . Healthy Brother   . Diabetes Maternal Grandmother   . Other Maternal Grandfather        unsure of medical history  . Other Paternal Grandmother        unsure of medical history  . Other Paternal Grandfather        unsure of medical history  . Healthy Sister     SOCIAL HISTORY: Social History   Socioeconomic History  .  Marital status: Single    Spouse name: Not on file  . Number of children: 1  . Years of education: college  . Highest education level: Not on file  Occupational History  . Occupation: Therapist, sports - Nutritional therapist of diseases  Tobacco Use  . Smoking status: Never Smoker  . Smokeless tobacco: Never Used  Substance and Sexual Activity  . Alcohol use: Yes    Comment: occasional wine  . Drug use: Never  . Sexual activity: Not on file  Other Topics Concern  . Not on file  Social History Narrative   Lives at home with her daughter.   Right-handed.   Caffeine use: 2 cups every other day.   Social Determinants of Health   Financial Resource Strain: Not on file  Food Insecurity: Not on file  Transportation Needs: Not on file  Physical Activity: Not on file  Stress: Not on file  Social Connections: Not on file  Intimate Partner Violence: Not on file   PHYSICAL EXAM  There were no vitals filed for this visit. There is no height or weight on file to calculate  BMI. Generalized: Well developed, in no acute distress  Neurological examination  Mentation: Alert oriented to time, place, history taking. Follows all commands speech and language fluent Cranial nerve II-XII: Pupils were equal round reactive to light. Extraocular movements were full, visual field were full on confrontational test. Facial sensation and strength were normal. Head turning and shoulder shrug were normal and symmetric. Motor: Strength in upper extremities is intact, 4/5 right and left hip flexion, edema to left lower extremity Sensory: Sensory testing is intact to soft touch on all 4 extremities. No evidence of extinction is noted.  Coordination: Cerebellar testing reveals good finger-nose-finger bilaterally, difficulty performing heel-to-shin Gait and station: Noted to somewhat drag left leg with walking Reflexes: Deep tendon reflexes are symmetric but left knee was 3+, right was 2+  DIAGNOSTIC DATA (LABS, IMAGING, TESTING) - I reviewed patient records, labs, notes, testing and imaging myself where available.  No results found for: WBC, HGB, HCT, MCV, PLT    Component Value Date/Time   ALBUMIN 4.0 07/10/2019 0826   No results found for: CHOL, HDL, LDLCALC, LDLDIRECT, TRIG, CHOLHDL No results found for: HGBA1C Lab Results  Component Value Date   VITAMINB12 344 04/23/2019   Lab Results  Component Value Date   TSH 2.650 04/23/2019      ASSESSMENT AND PLAN 45 y.o. year old female  has a past medical history of Back pain, Diabetes ([place]), Hypertension, Left leg weakness, Obesity, and Right leg pain. here with:  1.  Relapsing remitting multiple sclerosis -Confirmed by clinical history, physical exam, abnormal MRI of thoracic spine and brain, spinal fluid testing showed more than 5 oligoclonal banding -Positive JCV titer 3.72 -On Ocrevus, initial dose in July 2021, split 2nd dose September 29, 2019  2.  Right hip pain, gait abnormality -Never went for x-ray of right hip,  order still good at [place], she will get this done -Apparently, had nerve conduction prior of the right leg that was normal -Encouraged her to increase activity, exercise -Stop Flexeril, switch to tizanidine 4 mg up to 3 times daily as needed -Is also taking Cymbalta and gabapentin from PCP -Follow-up in 4 months or sooner if needed  I spent 30 minutes of face-to-face and non-face-to-face time with patient.  This included previsit chart review, lab review, study review, order entry, electronic health record documentation, patient education.  Butler Denmark, AGNP-C,  DNP 05/11/2020, 2:48 PM [facility] [address], [place],  [address] [phone]

## 2020-05-12 ENCOUNTER — Ambulatory Visit: Payer: No Typology Code available for payment source | Admitting: Neurology

## 2020-05-12 ENCOUNTER — Encounter: Payer: Self-pay | Admitting: Neurology

## 2020-05-18 DIAGNOSIS — Z0289 Encounter for other administrative examinations: Secondary | ICD-10-CM

## 2020-06-28 ENCOUNTER — Ambulatory Visit (INDEPENDENT_AMBULATORY_CARE_PROVIDER_SITE_OTHER): Payer: No Typology Code available for payment source | Admitting: Neurology

## 2020-06-28 ENCOUNTER — Other Ambulatory Visit: Payer: Self-pay

## 2020-06-28 ENCOUNTER — Encounter: Payer: Self-pay | Admitting: Neurology

## 2020-06-28 VITALS — BP 160/96 | HR 68 | Ht 68.0 in | Wt 299.0 lb

## 2020-06-28 DIAGNOSIS — G35 Multiple sclerosis: Secondary | ICD-10-CM | POA: Diagnosis not present

## 2020-06-28 DIAGNOSIS — R269 Unspecified abnormalities of gait and mobility: Secondary | ICD-10-CM

## 2020-06-28 NOTE — Patient Instructions (Addendum)
Check labs today  Check MRI of the brain  Check MRI Lumbar Spine  See you back in 4-6 months

## 2020-06-28 NOTE — Progress Notes (Addendum)
PATIENT: Jessica Hernandez DOB: [birth year]  REASON FOR VISIT: follow up HISTORY FROM: patient  HISTORY OF PRESENT ILLNESS: Today 06/28/20  HISTORY Jessica Hernandez is a 45 year old female, seen in request by neurosurgeon Dr. Christella Noa, and primary care physician Dr. Susa Day for evaluation of gait abnormality, new onset paresthesia, and abnormal MRI scans, initial evaluation was on April 23, 2019.  I have reviewed and summarized the referring note from the referring physician.  She had a past medical history of insulin-dependent diabetes, obesity, hypertension,  Around 2015, she noticed gradual onset left leg difficulty, she has to use her hands to lift up her left leg when step into the car, rely on the handrail to go up stairs, she denies significant pain, or sensory loss, she was seen by outside neurologist, had MRI of lumbar spine with no clear etiology was found, her left leg weakness has been persistent over the years  January 2020, she began to develop low back pain, radiating pain to right lower extremity, along the right lateral thigh, anterior shin, bottom of her right foot, also complains of slow worsening urinary urgency,  Since September 2020, she noticed left hand weakness, she has difficulty opening bottles with her left hand, denies pain, or sensory loss  She has been followed up by her optometrist on a yearly basis, there was no visual loss  Since January 2021, she noticed numbness tingling heating sensation at the anterior abdomen region, radiating aiding along left lower thoracic region,  I personally reviewed MRIs in January 2021,  MRI of brain, multiple T2/flair hyperintensity within the deep periventricular white multiple T2/flair hyperintensity lesion within the deep periventricular white matter, differentiation diagnosis including multiple sclerosis versus small vessel disease  MRI of the cervical spine no intrinsic cord lesion, no significant degenerative  disease  MRI of lumbar, moderate facet hypertrophy at L4-5, with prominent infusion in the joints bilaterally, no significant canal or foraminal narrowing  Laboratory evaluation November 2020, CMP elevated glucose 141, creatinine 0.67 negative ANA,  UPDATE Jul 23 2019: She came in today to discuss treatment option for her relapsing remitting multiple sclerosis, which has been confirmed by her history, abnormal physical findings and abnormal MRI of thoracic, brain,  We personally reviewed MRI of thoracic spine with without contrast in April 2021, T2 hyperintensity focus in the left lateral spinal cord adjacent to T5, no contrast-enhancement  MRI of cervical spine, no contrast-enhancement, no intrinsic cord lesion  MRI of the brain only showed mild periventricular deep white matter changes, no contrast-enhancement  Reviewed laboratory evaluations,  spinal fluid testing on Jul 10, 2019 showed more than 5 oligoclonal banding, consistent diagnosis of multiple sclerosis, WBC was 1, total protein was 41 Decreased vitamin D 11, she is on vitamin D supplement, Rest of the laboratory evaluation showed normal or negative Lyme titer, ACE, ANA, copper, ESR, C-reactive protein, TSH, folic acid, G18, HIV, RPR  She complains of gait abnormality, fatigue, lower extremity swelling, significant right hip pain   Update November 13, 2019 SS: Positive JCV titer 3.72.  She has been started on Ocrevus, her second initial dose was August 2.  Went to the ER August 8, complaining of fever and tachycardia. No etiology was determined, possibly related to Cymbalta?  Continues with same complaints, weakness to the left leg and swelling, significant pain to the right hip/buttocks, radiating down right leg to heel.  Is on Flexeril, gabapentin, Cymbalta 60 mg daily (cut out the 30 mg additional).  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  No falls. B & B are okay.  Never went to get x-ray of right hip/pelvis. Reportedly, saw  Dr. Christella Noa before referred her, had nerve conduction of right leg, was reportedly normal.  Is rather sedentary, tried physical therapy, too painful, lack of benefit.  Update Jun 28, 2020 SS: Here today alone, claims balance has gotten worse, spasms in the left leg, weakness left leg, few falls. Pain to right leg. Is working as a Marine scientist from home, not able to do this for past 3 days, asking for [employer]. See pain management, injection in right hip, didn't help. Due to go back. Last Ocrevus was Feb 15 th. Still on Cymbalta 60 AM/30 Mg PM,  Gabapentin 900 mg AM, 300 at PM, 4 mg twice daily. This regimen gets her through. Will have good and bad days. Isn't comfortable, sitting here now is painful to entire right leg. At times trouble getting words out when working.   REVIEW OF SYSTEMS: Out of a complete 14 system review of symptoms, the patient complains only of the following symptoms, and all other reviewed systems are negative.   See HPI  ALLERGIES: Allergies  Allergen Reactions  . Clarithromycin Other (See Comments)    Other reaction(s): trouble swallowing    HOME MEDICATIONS: Outpatient Medications Prior to Visit  Medication Sig Dispense Refill  . acetaminophen (TYLENOL) 500 MG tablet Take 2 tablets by mouth daily as needed.     . ASPIRIN 81 PO Take 81 mg by mouth daily.     . Cholecalciferol (VITAMIN D3 PO) Take 2,000 Units by mouth daily.    . diphenhydrAMINE (BENADRYL) 50 MG tablet Take 50 mg by mouth at bedtime as needed for itching.    . DULoxetine (CYMBALTA) 30 MG capsule Take 30 mg by mouth at bedtime.    . DULoxetine (CYMBALTA) 60 MG capsule Take 60 mg by mouth daily.    Marland Kitchen gabapentin (NEURONTIN) 300 MG capsule Take 300 mg by mouth 3 (three) times daily.    . insulin detemir (LEVEMIR) 100 UNIT/ML injection Inject 30 Units into the skin 2 (two) times daily.     Marland Kitchen lisinopril-hydrochlorothiazide (ZESTORETIC) 10-12.5 MG tablet Take 1 tablet by mouth daily.    . metFORMIN (GLUCOPHAGE) 1000 MG  tablet Take 1,000 mg by mouth 2 (two) times daily with a meal.     . ocrelizumab (OCREVUS) 300 MG/10ML injection Inject 600 mg into the vein. 39m IV on day 1 & 15. 6073mIV q 6 months thereafter. [facility].    . Semaglutide (OZEMPIC, 0.25 OR 0.5 MG/DOSE, [place]) Inject 0.25 mg into the skin once a week.    . Marland KitcheniZANidine (ZANAFLEX) 4 MG tablet TAKE 1 TABLET (4 MG TOTAL) BY MOUTH EVERY 8 (EIGHT) HOURS AS NEEDED FOR MUSCLE SPASMS 270 tablet 0  . Cyanocobalamin (VITAMIN B-12 IJ) Inject 1,000 mcg as directed once a week.     No facility-administered medications prior to visit.    PAST MEDICAL HISTORY: Past Medical History:  Diagnosis Date  . Back pain   . Diabetes ([place]  . Hypertension   . Left leg weakness   . Obesity   . Right leg pain     PAST SURGICAL HISTORY: Past Surgical History:  Procedure Laterality Date  . CHOLECYSTECTOMY    . FOOT SURGERY     age 45  FAMILY HISTORY: Family History  Problem Relation Age of Onset  . Multiple sclerosis Sister   . Diabetes Mother   .  Thyroid cancer Mother   . Hypertension Mother   . Rheum arthritis Mother   . Hypertension Father   . Prostate cancer Father   . Transient ischemic attack Father   . Healthy Brother   . Diabetes Maternal Grandmother   . Other Maternal Grandfather        unsure of medical history  . Other Paternal Grandmother        unsure of medical history  . Other Paternal Grandfather        unsure of medical history  . Healthy Sister     SOCIAL HISTORY: Social History   Socioeconomic History  . Marital status: Single    Spouse name: Not on file  . Number of children: 1  . Years of education: college  . Highest education level: Not on file  Occupational History  . Occupation: Therapist, sports - Nutritional therapist of diseases  Tobacco Use  . Smoking status: Never Smoker  . Smokeless tobacco: Never Used  Substance and Sexual Activity  . Alcohol use: Yes    Comment: occasional wine  . Drug use: Never  . Sexual  activity: Not on file  Other Topics Concern  . Not on file  Social History Narrative   Lives at home with her daughter.   Right-handed.   Caffeine use: 2 cups every other day.   Social Determinants of Health   Financial Resource Strain: Not on file  Food Insecurity: Not on file  Transportation Needs: Not on file  Physical Activity: Not on file  Stress: Not on file  Social Connections: Not on file  Intimate Partner Violence: Not on file   PHYSICAL EXAM  Vitals:   06/28/20 1434  BP: (!) 160/96  Pulse: 68  Weight: 299 lb (135.6 kg)  Height: '5\' 8"'  (1.727 m)   Body mass index is 45.46 kg/m. Generalized: Well developed, in no acute distress  Neurological examination  Mentation: Alert oriented to time, place, history taking. Follows all commands speech and language fluent Cranial nerve II-XII: Pupils were equal round reactive to light. Extraocular movements were full, visual field were full on confrontational test. Facial sensation and strength were normal. Head turning and shoulder shrug were normal and symmetric. Motor: Strength in upper extremities is intact, 4/5 right and left hip flexion, edema to left lower extremity Sensory: Sensory testing is intact to soft touch on all 4 extremities. No evidence of extinction is noted.  Coordination: Cerebellar testing reveals good finger-nose-finger bilaterally, difficulty performing heel-to-shin on the left Gait and station: Noted to somewhat drag left leg with walking, limp Reflexes: Deep tendon reflexes are symmetric but left knee was 3+, right was 2+  DIAGNOSTIC DATA (LABS, IMAGING, TESTING) - I reviewed patient records, labs, notes, testing and imaging myself where available.  No results found for: WBC, HGB, HCT, MCV, PLT    Component Value Date/Time   ALBUMIN 4.0 07/10/2019 0826   No results found for: CHOL, HDL, LDLCALC, LDLDIRECT, TRIG, CHOLHDL No results found for: HGBA1C Lab Results  Component Value Date   VITAMINB12  344 04/23/2019   Lab Results  Component Value Date   TSH 2.650 04/23/2019    ASSESSMENT AND PLAN 45 y.o. year old female  has a past medical history of Back pain, Diabetes ([place]), Hypertension, Left leg weakness, Obesity, and Right leg pain. here with:  1.  Relapsing remitting multiple sclerosis -Confirmed by clinical history, physical exam, abnormal MRI of thoracic spine and brain, spinal fluid testing showed more than 5 oligoclonal banding -  Positive JCV titer 3.72 -On Ocrevus, initial dose in July 2021, split 2nd dose September 29, 2019, last was Feb 2022 -Check MRI of the brain with and without contrast for surveillance -Check routine labs today -Main issues are pain to right leg, weakness of left leg -Has FMLA papers for [place] to complete  2.  Right hip pain, gait abnormality -Check MRI lumbar spine without contrast (06/29/20 SS, changed) -Has seen pain management, given right hip injection without benefit, due for reevaluation -Apparently, had nerve conduction prior of the right leg that was normal -Encouraged her to increase activity, exercise -Continue tizanidine 4 mg up to 3 times daily as needed -Also on Cymbalta, gabapentin -Follow-up in 4 to 6 months with Dr. Krista Blue, sooner if needed, depending on imaging  Evangeline Dakin, DNP 06/28/2020, 3:20 PM [facility] [address], [place], [place] [address] [phone]

## 2020-06-29 ENCOUNTER — Telehealth: Payer: Self-pay | Admitting: *Deleted

## 2020-06-29 ENCOUNTER — Telehealth: Payer: Self-pay | Admitting: Neurology

## 2020-06-29 LAB — IGG, IGA, IGM
IgA/Immunoglobulin A, Serum: 286 mg/dL (ref 87–352)
IgG (Immunoglobin G), Serum: 1421 mg/dL (ref 586–1602)
IgM (Immunoglobulin M), Srm: 82 mg/dL (ref 26–217)

## 2020-06-29 LAB — CBC WITH DIFFERENTIAL/PLATELET
Basophils Absolute: 0 10*3/uL (ref 0.0–0.2)
Basos: 1 %
EOS (ABSOLUTE): 0.1 10*3/uL (ref 0.0–0.4)
Eos: 1 %
Hematocrit: 39.5 % (ref 34.0–46.6)
Hemoglobin: 13.1 g/dL (ref 11.1–15.9)
Immature Grans (Abs): 0 10*3/uL (ref 0.0–0.1)
Immature Granulocytes: 0 %
Lymphocytes Absolute: 1.5 10*3/uL (ref 0.7–3.1)
Lymphs: 27 %
MCH: 29.1 pg (ref 26.6–33.0)
MCHC: 33.2 g/dL (ref 31.5–35.7)
MCV: 88 fL (ref 79–97)
Monocytes Absolute: 0.4 10*3/uL (ref 0.1–0.9)
Monocytes: 6 %
Neutrophils Absolute: 3.5 10*3/uL (ref 1.4–7.0)
Neutrophils: 65 %
Platelets: 353 10*3/uL (ref 150–450)
RBC: 4.5 x10E6/uL (ref 3.77–5.28)
RDW: 13 % (ref 11.7–15.4)
WBC: 5.4 10*3/uL (ref 3.4–10.8)

## 2020-06-29 LAB — COMPREHENSIVE METABOLIC PANEL
ALT: 38 IU/L — ABNORMAL HIGH (ref 0–32)
AST: 13 IU/L (ref 0–40)
Albumin/Globulin Ratio: 1.5 (ref 1.2–2.2)
Albumin: 4.3 g/dL (ref 3.8–4.8)
Alkaline Phosphatase: 115 IU/L (ref 44–121)
BUN/Creatinine Ratio: 13 (ref 9–23)
BUN: 10 mg/dL (ref 6–24)
Bilirubin Total: 0.4 mg/dL (ref 0.0–1.2)
CO2: 24 mmol/L (ref 20–29)
Calcium: 9.5 mg/dL (ref 8.7–10.2)
Chloride: 102 mmol/L (ref 96–106)
Creatinine, Ser: 0.78 mg/dL (ref 0.57–1.00)
Globulin, Total: 2.9 g/dL (ref 1.5–4.5)
Glucose: 195 mg/dL — ABNORMAL HIGH (ref 65–99)
Potassium: 4.2 mmol/L (ref 3.5–5.2)
Sodium: 142 mmol/L (ref 134–144)
Total Protein: 7.2 g/dL (ref 6.0–8.5)
eGFR: 95 mL/min/{1.73_m2} (ref >59)

## 2020-06-29 NOTE — Telephone Encounter (Signed)
LMVM for pt to return call about her FMLA form.  Completed to SS/NP for review and completion and signature.

## 2020-06-29 NOTE — Addendum Note (Signed)
Addended by: Glean Salvo on: 06/29/2020 04:17 PM   Modules accepted: Orders

## 2020-06-29 NOTE — Telephone Encounter (Signed)
[employer] order sent to [facility]. They will reach out to the patient to scheduled and obtain the auth.

## 2020-06-30 ENCOUNTER — Telehealth: Payer: Self-pay

## 2020-06-30 NOTE — Telephone Encounter (Signed)
-----   Message from Glean Salvo, NP sent at 06/29/2020  4:18 PM EDT ----- Sent my chart message: Jennene,  Blood work showed an elevated glucose 195.  Continue to try to gain good glycemic control.  IgG, IgA, IgM are within normal limits, will continue Ocrevus every 6 months.  We will update once MRI results are available. Take Care, Maralyn Sago

## 2020-07-01 NOTE — Telephone Encounter (Signed)
Completed and signed, to medical records.

## 2020-07-05 DIAGNOSIS — Z0289 Encounter for other administrative examinations: Secondary | ICD-10-CM

## 2020-07-05 NOTE — Telephone Encounter (Signed)
[employer] approved the MRI Brain but not the MRI Lumbar spine.  "your records show that you may have a disease affecting your brain, spinal cord, and the optic nerves in your eyes that progresses over time (MS). It can cause problems with vision, balance, muscle control, and other basic body functions that recur over time. The request cannot be approved because: Lumbar imaging is not needed to view the spinal cord. The entire spinal cord can be seen by imaging the other two regions of the spine (thoracic * cervical)."  There is an option for a peer to peer. It does need to be scheduled it would need to be scheduled by May 20. The case number is [id number] and the phone number is [phone] option 1. Patient is scheduled at [facility] for 07/11/20.

## 2020-07-06 NOTE — Telephone Encounter (Signed)
MRI Lumbar Spine approved [id number], exp date Jan 02, 2021

## 2020-07-06 NOTE — Telephone Encounter (Signed)
Noted, thank you!!! Patient is scheduled at [facility] for 07/11/20

## 2020-07-11 ENCOUNTER — Other Ambulatory Visit: Payer: Self-pay

## 2020-07-11 ENCOUNTER — Ambulatory Visit
Admission: RE | Admit: 2020-07-11 | Discharge: 2020-07-11 | Disposition: A | Discharge status: Home / Self Care | Payer: No Typology Code available for payment source | Source: Ambulatory Visit | Attending: Neurology | Admitting: Neurology

## 2020-07-11 DIAGNOSIS — G35 Multiple sclerosis: Secondary | ICD-10-CM

## 2020-07-11 DIAGNOSIS — R269 Unspecified abnormalities of gait and mobility: Secondary | ICD-10-CM

## 2020-07-11 MED ORDER — GADOBENATE DIMEGLUMINE 529 MG/ML IV SOLN
20.0000 mL | Freq: Once | INTRAVENOUS | Status: AC | PRN
  Administered 2020-07-11: 20 mL via INTRAVENOUS

## 2020-07-12 ENCOUNTER — Telehealth: Payer: Self-pay | Admitting: Neurology

## 2020-07-12 NOTE — Telephone Encounter (Signed)
Please call the patient, MRI of the brain was overall stable compared to previous in January 2021, no new lesions.  MRI of the lumbar spine showed no acute problems, at L4-5, mild disc bulge, moderate facet hypertrophy, L5-S1 mild spinal stenosis.  There is no nerve root compression.  No significant changes compared to previous MRI in October 2020.  Have her please follow-up with pain management for reevaluation.

## 2020-07-13 NOTE — Telephone Encounter (Signed)
I think this would be best addressed by PCP or orthopedics, she could check with her pain doctor as well.

## 2020-07-13 NOTE — Telephone Encounter (Signed)
Called pt and gave her the results.  She had since the MRI was overall no change she asked about having MRI R hip.  I think SS/NP would like her to see PM, but was not sure.  Pt then said she would email SS/NP.  I relayed that was fine.  She did verbalized understanding of Brain and Lspine results.

## 2020-07-20 ENCOUNTER — Telehealth: Payer: Self-pay | Admitting: *Deleted

## 2020-07-20 NOTE — Telephone Encounter (Signed)
Ocrevus orders signed by Dr Yan, given to intrafusion. 

## 2020-07-21 ENCOUNTER — Other Ambulatory Visit: Payer: Self-pay | Admitting: Neurology

## 2020-09-07 ENCOUNTER — Encounter: Payer: Self-pay | Admitting: Neurology

## 2020-11-10 NOTE — Progress Notes (Signed)
Chart reviewed, agree above plan ?

## 2020-11-30 ENCOUNTER — Telehealth (INDEPENDENT_AMBULATORY_CARE_PROVIDER_SITE_OTHER): Payer: No Typology Code available for payment source | Admitting: Neurology

## 2020-11-30 ENCOUNTER — Encounter: Payer: Self-pay | Admitting: Neurology

## 2020-11-30 DIAGNOSIS — G35 Multiple sclerosis: Secondary | ICD-10-CM | POA: Diagnosis not present

## 2020-11-30 DIAGNOSIS — R269 Unspecified abnormalities of gait and mobility: Secondary | ICD-10-CM | POA: Diagnosis not present

## 2020-11-30 DIAGNOSIS — R5383 Other fatigue: Secondary | ICD-10-CM | POA: Diagnosis not present

## 2020-11-30 DIAGNOSIS — G35A Relapsing-remitting multiple sclerosis: Secondary | ICD-10-CM

## 2020-11-30 NOTE — Progress Notes (Addendum)
ASSESSMENT AND PLAN  Jessica Hernandez is a 45 y.o. female   Relapsing remitting multiple sclerosis Chronic low back pain Gait abnormalities Fatigue  She is tolerating ocrelizumab,  She is receiving epidural injection for her low back pain radiating pain for left lower extremity, also under pain management, taking tizanidine, Cymbalta 90 mg daily, gabapentin 300 mg 3 times a day fatigue     DIAGNOSTIC DATA (LABS, IMAGING, TESTING) - I reviewed patient records, labs, notes, testing and imaging myself where available.  Laboratory evaluation in May 2022 showed within normal range of IgG IgA IgM, CMP glucose 195, CBC hemoglobin of 13.1, A1c of 7.9    MEDICAL HISTORY: Jessica Hernandez is a 45 year old female, seen in request by neurosurgeon Dr. Christella Noa, and primary care physician Dr. Susa Day for evaluation of gait abnormality, new onset paresthesia, and abnormal MRI scans, initial evaluation was on April 23, 2019.   I have reviewed and summarized the referring note from the referring physician.  She had a past medical history of insulin-dependent diabetes, obesity, hypertension,   Around 2015, she noticed gradual onset left leg difficulty, she has to use her hands to lift up her left leg when step into the car, rely on the handrail to go up stairs, she denies significant pain, or sensory loss, she was seen by outside neurologist, had MRI of lumbar spine with no clear etiology was found, her left leg weakness has been persistent over the years   January 2020, she began to develop low back pain, radiating pain to right lower extremity, along the right lateral thigh, anterior shin, bottom of her right foot, also complains of slow worsening urinary urgency,   Since September 2020, she noticed left hand weakness, she has difficulty opening bottles with her left hand, denies pain, or sensory loss   She has been followed up by her optometrist on a yearly basis, there was no visual loss   Since  January 2021, she noticed numbness tingling heating sensation at the anterior abdomen region, radiating aiding along left lower thoracic region,   I personally reviewed MRIs in January 2021,   MRI of brain, multiple T2/flair hyperintensity within the deep periventricular white multiple T2/flair hyperintensity lesion within the deep periventricular white matter, differentiation diagnosis including multiple sclerosis versus small vessel disease   MRI of the cervical spine no intrinsic cord lesion, no significant degenerative disease   MRI of lumbar, moderate facet hypertrophy at L4-5, with prominent infusion in the joints bilaterally, no significant canal or foraminal narrowing   Laboratory evaluation November 2020, CMP elevated glucose 141, creatinine 0.67 negative ANA,   UPDATE Jul 23 2019: She came in today to discuss treatment option for her relapsing remitting multiple sclerosis, which has been confirmed by her history, abnormal physical findings and abnormal MRI of thoracic, brain,   We personally reviewed MRI of thoracic spine with without contrast in April 2021, T2 hyperintensity focus in the left lateral spinal cord adjacent to T5, no contrast-enhancement   MRI of cervical spine, no contrast-enhancement, no intrinsic cord lesion   MRI of the brain only showed mild periventricular deep white matter changes, no contrast-enhancement   Reviewed laboratory evaluations,  spinal fluid testing on Jul 10, 2019 showed more than 5 oligoclonal banding, consistent diagnosis of multiple sclerosis, WBC was 1, total protein was 41 Decreased vitamin D 11, she is on vitamin D supplement, Rest of the laboratory evaluation showed normal or negative Lyme titer, ACE, ANA, copper, ESR, C-reactive protein, TSH, folic acid,  B12, HIV, RPR   She complains of gait abnormality, fatigue, lower extremity swelling, significant right hip pain   Virtual Visit via video Location: provider: [facility], patient:  home I  connected with Julieta Gutting on Nov 30 2020 by a video enabled telemedicine application and verified that I am speaking with the correct person using two  identifiers.  JC virus titer was 3.72, she received ocrelizumab since summer 2021, tolerating it well,   She is also under pain management for chronic low back pain radiating pain to left lower extremity, taking Cymbalta 60 mg in the morning/30 mg at night, gabapentin 300 mg 3 times a day, received epidural injection with minimal help in April 2022, going to receive second injection December 18, 2020  I personally reviewed MRI lumbar in May 2022: 1.   The lower spinal cord and conus medullaris appear normal. 2.   At L4-L5, there is mild disc bulging and moderate facet hypertrophy.  No nerve root compression or spinal stenosis. 3.   At L5-S1, there is a broad-based disc protrusion, mild facet hypertrophy and prominent epidural fat combining to cause mild spinal stenosis.  There is no nerve root compression. 4.   No significant changes compared to the MRI dated 12/15/2018.  MRI of brain with and without contrast in May 2022: No contrast-enhancement, stable multiple T2/FLAIR hyperintensity in hemisphere consistent with chronic demyelinating plaque, no change compared to previous MRI in January 2021    PHYSICAL EXAM: PHYSICAL EXAMNIATION:  Gen: NAD, conversant, well nourised, well groomed       Awake, alert, oriented to history taking and casual conversation, facial symmetric, no dysarthria, no aphasia, moving upper extremity without difficulty, dragging left leg while ambulating, no trunk ataxia or limb dysmetria noted  REVIEW OF SYSTEMS:  Full 14 system review of systems performed and notable only for as above All other review of systems were negative.   ALLERGIES: Allergies  Allergen Reactions   Clarithromycin Other (See Comments)    Other reaction(s): trouble swallowing    HOME MEDICATIONS: Current Outpatient Medications   Medication Sig Dispense Refill   acetaminophen (TYLENOL) 500 MG tablet Take 2 tablets by mouth daily as needed.      ASPIRIN 81 PO Take 81 mg by mouth daily.      Cholecalciferol (VITAMIN D3 PO) Take 2,000 Units by mouth daily.     diphenhydrAMINE (BENADRYL) 50 MG tablet Take 50 mg by mouth at bedtime as needed for itching.     DULoxetine (CYMBALTA) 30 MG capsule Take 30 mg by mouth at bedtime.     DULoxetine (CYMBALTA) 60 MG capsule Take 60 mg by mouth daily.     gabapentin (NEURONTIN) 300 MG capsule Take 300 mg by mouth 3 (three) times daily.     insulin detemir (LEVEMIR) 100 UNIT/ML injection Inject 30 Units into the skin 2 (two) times daily.      lisinopril-hydrochlorothiazide (ZESTORETIC) 10-12.5 MG tablet Take 1 tablet by mouth daily.     metFORMIN (GLUCOPHAGE) 1000 MG tablet Take 1,000 mg by mouth 2 (two) times daily with a meal.      ocrelizumab (OCREVUS) 300 MG/10ML injection Inject 600 mg into the vein. 318m IV on day 1 & 15. 6030mIV q 6 months thereafter. [facility].     Semaglutide (OZEMPIC, 0.25 OR 0.5 MG/DOSE, [place]) Inject 0.25 mg into the skin once a week.     tiZANidine (ZANAFLEX) 4 MG tablet TAKE 1 TABLET (4 MG TOTAL) BY MOUTH EVERY 8 (EIGHT)  HOURS AS NEEDED FOR MUSCLE SPASMS 270 tablet 3   No current facility-administered medications for this visit.    PAST MEDICAL HISTORY: Past Medical History:  Diagnosis Date   Back pain    Diabetes ([facility])    Hypertension    Left leg weakness    Obesity    Right leg pain     PAST SURGICAL HISTORY: Past Surgical History:  Procedure Laterality Date   CHOLECYSTECTOMY     FOOT SURGERY     age 67    FAMILY HISTORY: Family History  Problem Relation Age of Onset   Multiple sclerosis Sister    Diabetes Mother    Thyroid cancer Mother    Hypertension Mother    Rheum arthritis Mother    Hypertension Father    Prostate cancer Father    Transient ischemic attack Father    Healthy Brother    Diabetes Maternal Grandmother     Other Maternal Grandfather        unsure of medical history   Other Paternal Grandmother        unsure of medical history   Other Paternal Grandfather        unsure of medical history   Healthy Sister     SOCIAL HISTORY: Social History   Socioeconomic History   Marital status: Single    Spouse name: Not on file   Number of children: 1   Years of education: college   Highest education level: Not on file  Occupational History   Occupation: Therapist, sports - phone management of diseases  Tobacco Use   Smoking status: Never   Smokeless tobacco: Never  Substance and Sexual Activity   Alcohol use: Yes    Comment: occasional wine   Drug use: Never   Sexual activity: Not on file  Other Topics Concern   Not on file  Social History Narrative   Lives at home with her daughter.   Right-handed.   Caffeine use: 2 cups every other day.   Social Determinants of Health   Financial Resource Strain: Not on file  Food Insecurity: Not on file  Transportation Needs: Not on file  Physical Activity: Not on file  Stress: Not on file  Social Connections: Not on file  Intimate Partner Violence: Not on file      Marcial Pacas, M.D. Ph.D.  [facility] [address] [place], [place] [address] Ph: [phone] Fax: [phone]  CC:  Cathlean Sauer, MD [address] [place],  [place] [address]  Cathlean Sauer, MD

## 2021-01-03 DIAGNOSIS — Z975 Presence of (intrauterine) contraceptive device: Secondary | ICD-10-CM | POA: Insufficient documentation

## 2021-04-07 DIAGNOSIS — L2084 Intrinsic (allergic) eczema: Secondary | ICD-10-CM | POA: Insufficient documentation

## 2021-06-27 ENCOUNTER — Encounter: Payer: Self-pay | Admitting: Neurology

## 2021-06-27 ENCOUNTER — Ambulatory Visit (INDEPENDENT_AMBULATORY_CARE_PROVIDER_SITE_OTHER): Payer: No Typology Code available for payment source | Admitting: Neurology

## 2021-06-27 VITALS — BP 159/108 | HR 90 | Ht 68.0 in | Wt 302.0 lb

## 2021-06-27 DIAGNOSIS — G8929 Other chronic pain: Secondary | ICD-10-CM

## 2021-06-27 DIAGNOSIS — R269 Unspecified abnormalities of gait and mobility: Secondary | ICD-10-CM | POA: Diagnosis not present

## 2021-06-27 DIAGNOSIS — G4710 Hypersomnia, unspecified: Secondary | ICD-10-CM | POA: Diagnosis not present

## 2021-06-27 DIAGNOSIS — G35 Multiple sclerosis: Secondary | ICD-10-CM

## 2021-06-27 DIAGNOSIS — M5441 Lumbago with sciatica, right side: Secondary | ICD-10-CM | POA: Diagnosis not present

## 2021-06-27 NOTE — Progress Notes (Addendum)
ASSESSMENT AND PLAN  Jessica Hernandez is a 46 y.o. female   Relapsing remitting multiple sclerosis Chronic low back pain Gait abnormalities Fatigue At risk for obstructive sleep apnea   She is tolerating ocrelizumab, started since summer 2021, high JCV titer 3.72 She is receiving epidural injection for her low back pain radiating pain for left lower extremity, also under pain management, taking tizanidine, Cymbalta 90 mg daily, gabapentin 300 mg 3 times a day  She has excessive fatigue, sleepiness, very narrow oropharyngeal space, obesity, snoring, at risk for obstructive sleep apnea, referral to sleep study Repeat MRI of the brain without contrast, Laboratory evaluations Encouraged her moderate exercise, water aerobic, Return to clinic with nurse practitioner in 9 months   Addendum: I personally reviewed MRI of brain with without contrast on Jul 11, 2020, also previous MRI thoracic spine, there is evidence of left lateral T5 spinal cord involvement, more than 9 T2  lesions on MRI of the brain, patient continued complaints of low back pain, gait abnormality, left leg weakness,  Above clinical presentation and MRI findings made her her good candidate for aggressive disease modification treatment, such as continued ocrelizumab infusion.   DIAGNOSTIC DATA (LABS, IMAGING, TESTING) - I reviewed patient records, labs, notes, testing and imaging myself where available.  Laboratory evaluation in May 2022 showed within normal range of IgG IgA IgM, CMP glucose 195, CBC hemoglobin of 13.1, A1c of 7.9    MEDICAL HISTORY: Jessica Hernandez is a 46 year old female, seen in request by neurosurgeon Dr. Christella Noa, and primary care physician Dr. Susa Day for evaluation of gait abnormality, new onset paresthesia, and abnormal MRI scans, initial evaluation was on April 23, 2019.   I have reviewed and summarized the referring note from the referring physician.  She had a past medical history of  insulin-dependent diabetes, obesity, hypertension,   Around 2015, she noticed gradual onset left leg difficulty, she has to use her hands to lift up her left leg when step into the car, rely on the handrail to go up stairs, she denies significant pain, or sensory loss, she was seen by outside neurologist, had MRI of lumbar spine with no clear etiology was found, her left leg weakness has been persistent over the years   January 2020, she began to develop low back pain, radiating pain to right lower extremity, along the right lateral thigh, anterior shin, bottom of her right foot, also complains of slow worsening urinary urgency,   Since September 2020, she noticed left hand weakness, she has difficulty opening bottles with her left hand, denies pain, or sensory loss   She has been followed up by her optometrist on a yearly basis, there was no visual loss   Since January 2021, she noticed numbness tingling heating sensation at the anterior abdomen region, radiating aiding along left lower thoracic region,   I personally reviewed MRIs in January 2021,   MRI of brain, multiple T2/flair hyperintensity within the deep periventricular white multiple T2/flair hyperintensity lesion within the deep periventricular white matter, differentiation diagnosis including multiple sclerosis versus small vessel disease   MRI of the cervical spine no intrinsic cord lesion, no significant degenerative disease   MRI of lumbar, moderate facet hypertrophy at L4-5, with prominent infusion in the joints bilaterally, no significant canal or foraminal narrowing   Laboratory evaluation November 2020, CMP elevated glucose 141, creatinine 0.67 negative ANA,   UPDATE Jul 23 2019: She came in today to discuss treatment option for her relapsing remitting multiple sclerosis, which  has been confirmed by her history, abnormal physical findings and abnormal MRI of thoracic, brain,   We personally reviewed MRI of thoracic spine  with without contrast in April 2021, T2 hyperintensity focus in the left lateral spinal cord adjacent to T5, no contrast-enhancement   MRI of cervical spine, no contrast-enhancement, no intrinsic cord lesion   MRI of the brain only showed mild periventricular deep white matter changes, no contrast-enhancement   Reviewed laboratory evaluations,  spinal fluid testing on Jul 10, 2019 showed more than 5 oligoclonal banding, consistent diagnosis of multiple sclerosis, WBC was 1, total protein was 41 Decreased vitamin D 11, she is on vitamin D supplement, Rest of the laboratory evaluation showed normal or negative Lyme titer, ACE, ANA, copper, ESR, C-reactive protein, TSH, folic acid, A56, HIV, RPR   She complains of gait abnormality, fatigue, lower extremity swelling, significant right hip pain   Virtual Visit via video Location: provider: [facility], patient:  home I connected with Jessica Hernandez on Nov 30 2020 by a video enabled telemedicine application and verified that I am speaking with the correct person using two  identifiers.  JC virus titer was 3.72, she received ocrelizumab since summer 2021, tolerating it well,  She is also under pain management for chronic low back pain radiating pain to left lower extremity, taking Cymbalta 60 mg in the morning/30 mg at night, gabapentin 300 mg 3 times a day, received epidural injection with minimal help in April 2022, going to receive second injection December 18, 2020  I personally reviewed MRI lumbar in May 2022: 1.   The lower spinal cord and conus medullaris appear normal. 2.   At L4-L5, there is mild disc bulging and moderate facet hypertrophy.  No nerve root compression or spinal stenosis. 3.   At L5-S1, there is a broad-based disc protrusion, mild facet hypertrophy and prominent epidural fat combining to cause mild spinal stenosis.  There is no nerve root compression. 4.   No significant changes compared to the MRI dated 12/15/2018.  MRI of brain  with and without contrast in May 2022: No contrast-enhancement, stable multiple T2/FLAIR hyperintensity in hemisphere consistent with chronic demyelinating plaque, no change compared to previous MRI in January 2021  UPDATE Jun 27 2021: She is changing job to [employer], continue to have low back pain, urinary urgency, frequency, is under the care of pain management, receiving epidural injection,  There is no significant MS flareup, intermittent upper extremity paresthesia, increased fatigue, especially at the end of ocrelizumab infusion cycle,  She has weight issues, complaints of snoring, excessive daytime fatigue, sleepiness,  PHYSICAL EXAMNIATION:   PHYSICAL EXAMNIATION:  Gen: NAD, conversant, well nourised, well groomed                     Cardiovascular: Regular rate rhythm, no peripheral edema, warm, nontender. Eyes: Conjunctivae clear without exudates or hemorrhage Neck: Supple, no carotid bruits. Pulmonary: Clear to auscultation bilaterally   NEUROLOGICAL EXAM:  MENTAL STATUS: Speech/cognition: Awake, alert, oriented to history taking and casual conversation   CRANIAL NERVES: CN II: Visual fields are full to confrontation.  Pupils are round equal and briskly reactive to light. CN III, IV, VI: extraocular movement are normal. No ptosis. CN V: Facial sensation is intact to pinprick in all 3 divisions bilaterally. Corneal responses are intact.  CN VII: Face is symmetric with normal eye closure and smile. CN VIII: Hearing is normal to casual conversation CN IX, X: Palate elevates symmetrically. Phonation is normal. CN  XI: Head turning and shoulder shrug are intact   MOTOR: There is no pronator drift of out-stretched arms. Muscle bulk and tone are normal. Muscle strength is normal.  REFLEXES: Reflexes are 1 and symmetric at the biceps, triceps, knees, and ankles. Plantar responses are flexor.  SENSORY: Intact to light touch, pinprick, positional and vibratory sensation are  intact in fingers and toes.  COORDINATION: Rapid alternating movements and fine finger movements are intact. There is no dysmetria on finger-to-nose and heel-knee-shin.    GAIT/STANCE: She needs push-up to get up from seated position, dragging left leg, antalgic, limited by her big body habitus    REVIEW OF SYSTEMS:  Full 14 system review of systems performed and notable only for as above All other review of systems were negative.   ALLERGIES: Allergies  Allergen Reactions   Sulfamethoxazole-Trimethoprim Anaphylaxis   Clarithromycin Other (See Comments)    Other reaction(s): trouble swallowing    HOME MEDICATIONS: Current Outpatient Medications  Medication Sig Dispense Refill   acetaminophen (TYLENOL) 500 MG tablet Take 2 tablets by mouth daily as needed.      ASPIRIN 81 PO Take 81 mg by mouth daily.      celecoxib (CELEBREX) 200 MG capsule Take 1 capsule by mouth 2 (two) times daily.     Cholecalciferol (VITAMIN D3 PO) Take 2,000 Units by mouth daily.     diphenhydrAMINE (BENADRYL) 50 MG tablet Take 50 mg by mouth at bedtime as needed for itching.     DULoxetine (CYMBALTA) 30 MG capsule Take 30 mg by mouth at bedtime.     DULoxetine (CYMBALTA) 60 MG capsule Take 60 mg by mouth daily.     gabapentin (NEURONTIN) 300 MG capsule Take 300 mg by mouth 3 (three) times daily.     insulin detemir (LEVEMIR) 100 UNIT/ML injection Inject 30 Units into the skin 2 (two) times daily.      lisinopril-hydrochlorothiazide (ZESTORETIC) 10-12.5 MG tablet Take 1 tablet by mouth daily.     metFORMIN (GLUCOPHAGE) 1000 MG tablet Take 1,000 mg by mouth 2 (two) times daily with a meal.      ocrelizumab (OCREVUS) 300 MG/10ML injection Inject 600 mg into the vein. 330m IV on day 1 & 15. 6016mIV q 6 months thereafter. [facility].     Semaglutide (OZEMPIC, 0.25 OR 0.5 MG/DOSE, [place]) Inject 0.25 mg into the skin once a week.     tiZANidine (ZANAFLEX) 4 MG tablet TAKE 1 TABLET (4 MG TOTAL) BY MOUTH EVERY  8 (EIGHT) HOURS AS NEEDED FOR MUSCLE SPASMS 270 tablet 3   No current facility-administered medications for this visit.    PAST MEDICAL HISTORY: Past Medical History:  Diagnosis Date   Back pain    Diabetes ([place]   Hypertension    Left leg weakness    Obesity    Right leg pain     PAST SURGICAL HISTORY: Past Surgical History:  Procedure Laterality Date   CHOLECYSTECTOMY     FOOT SURGERY     age 46  FAMILY HISTORY: Family History  Problem Relation Age of Onset   Multiple sclerosis Sister    Diabetes Mother    Thyroid cancer Mother    Hypertension Mother    Rheum arthritis Mother    Hypertension Father    Prostate cancer Father    Transient ischemic attack Father    Healthy Brother    Diabetes Maternal Grandmother    Other Maternal Grandfather  unsure of medical history   Other Paternal Grandmother        unsure of medical history   Other Paternal Grandfather        unsure of medical history   Healthy Sister     SOCIAL HISTORY: Social History   Socioeconomic History   Marital status: Single    Spouse name: Not on file   Number of children: 1   Years of education: college   Highest education level: Not on file  Occupational History   Occupation: Therapist, sports - phone management of diseases  Tobacco Use   Smoking status: Never   Smokeless tobacco: Never  Substance and Sexual Activity   Alcohol use: Yes    Comment: occasional wine   Drug use: Never   Sexual activity: Not on file  Other Topics Concern   Not on file  Social History Narrative   Lives at home with her daughter.   Right-handed.   Caffeine use: 2 cups every other day.   Social Determinants of Health   Financial Resource Strain: Not on file  Food Insecurity: Not on file  Transportation Needs: Not on file  Physical Activity: Not on file  Stress: Not on file  Social Connections: Not on file  Intimate Partner Violence: Not on file      Marcial Pacas, M.D. Ph.D.  [facility] [address], [place], [place] [address] Ph: [phone] Fax: [phone]  CC:  Cathlean Sauer, MD [address] [place],  [place] [address]  Cathlean Sauer, MD primary

## 2021-06-28 LAB — COMPREHENSIVE METABOLIC PANEL
ALT: 25 IU/L (ref 0–32)
AST: 15 IU/L (ref 0–40)
Albumin/Globulin Ratio: 1.4 (ref 1.2–2.2)
Albumin: 3.9 g/dL (ref 3.8–4.8)
Alkaline Phosphatase: 123 IU/L — ABNORMAL HIGH (ref 44–121)
BUN/Creatinine Ratio: 12 (ref 9–23)
BUN: 9 mg/dL (ref 6–24)
Bilirubin Total: 0.4 mg/dL (ref 0.0–1.2)
CO2: 24 mmol/L (ref 20–29)
Calcium: 9.2 mg/dL (ref 8.7–10.2)
Chloride: 98 mmol/L (ref 96–106)
Creatinine, Ser: 0.77 mg/dL (ref 0.57–1.00)
Globulin, Total: 2.7 g/dL (ref 1.5–4.5)
Glucose: 237 mg/dL — ABNORMAL HIGH (ref 70–99)
Potassium: 4.7 mmol/L (ref 3.5–5.2)
Sodium: 136 mmol/L (ref 134–144)
Total Protein: 6.6 g/dL (ref 6.0–8.5)
eGFR: 96 mL/min/{1.73_m2} (ref >59)

## 2021-06-28 LAB — IGG, IGA, IGM
IgA/Immunoglobulin A, Serum: 244 mg/dL (ref 87–352)
IgG (Immunoglobin G), Serum: 1176 mg/dL (ref 586–1602)
IgM (Immunoglobulin M), Srm: 67 mg/dL (ref 26–217)

## 2021-08-05 ENCOUNTER — Other Ambulatory Visit: Payer: Self-pay | Admitting: Neurology

## 2021-08-05 NOTE — Telephone Encounter (Signed)
Rx refilled.

## 2022-03-30 ENCOUNTER — Ambulatory Visit: Payer: Self-pay | Admitting: Neurology

## 2022-03-30 ENCOUNTER — Encounter: Payer: Self-pay | Admitting: Neurology

## 2022-03-30 NOTE — Progress Notes (Deleted)
ASSESSMENT AND PLAN  Jessica Hernandez is a 47 y.o. female   Relapsing remitting multiple sclerosis Chronic low back pain Gait abnormalities Fatigue At risk for obstructive sleep apnea   She is tolerating ocrelizumab, started since summer 2021, high JCV titer 3.72 She is receiving epidural injection for her low back pain radiating pain for left lower extremity, also under pain management, taking tizanidine, Cymbalta 90 mg daily, gabapentin 300 mg 3 times a day  She has excessive fatigue, sleepiness, very narrow oropharyngeal space, obesity, snoring, at risk for obstructive sleep apnea, referral to sleep study Repeat MRI of the brain without contrast, Laboratory evaluations Encouraged her moderate exercise, water aerobic, Return to clinic with nurse practitioner in 9 months      DIAGNOSTIC DATA (LABS, IMAGING, TESTING) - I reviewed patient records, labs, notes, testing and imaging myself where available.  Laboratory evaluation in May 2022 showed within normal range of IgG IgA IgM, CMP glucose 195, CBC hemoglobin of 13.1, A1c of 7.9  Laboratory evaluation May 2023 normal range IgG IgA IgM, CMP alkaline phosphatase 123, glucose 237  November 2023 A1c 7.0  Dr. Krista Blue reviewed MRI of brain with without contrast on Jul 11, 2020, also previous MRI thoracic spine, there is evidence of left lateral T5 spinal cord involvement, more than 9 T2  lesions on MRI of the brain, patient continued complaints of low back pain, gait abnormality, left leg weakness,  Above clinical presentation and MRI findings made her her good candidate for aggressive disease modification treatment, such as continued ocrelizumab infusion.  MEDICAL HISTORY: Jessica Hernandez is a 47 year old female, seen in request by neurosurgeon Dr. Christella Noa, and primary care physician Dr. Susa Day for evaluation of gait abnormality, new onset paresthesia, and abnormal MRI scans, initial evaluation was on April 23, 2019.   I have reviewed  and summarized the referring note from the referring physician.  She had a past medical history of insulin-dependent diabetes, obesity, hypertension,   Around 2015, she noticed gradual onset left leg difficulty, she has to use her hands to lift up her left leg when step into the car, rely on the handrail to go up stairs, she denies significant pain, or sensory loss, she was seen by outside neurologist, had MRI of lumbar spine with no clear etiology was found, her left leg weakness has been persistent over the years   January 2020, she began to develop low back pain, radiating pain to right lower extremity, along the right lateral thigh, anterior shin, bottom of her right foot, also complains of slow worsening urinary urgency,   Since September 2020, she noticed left hand weakness, she has difficulty opening bottles with her left hand, denies pain, or sensory loss   She has been followed up by her optometrist on a yearly basis, there was no visual loss   Since January 2021, she noticed numbness tingling heating sensation at the anterior abdomen region, radiating aiding along left lower thoracic region,   I personally reviewed MRIs in January 2021,   MRI of brain, multiple T2/flair hyperintensity within the deep periventricular white multiple T2/flair hyperintensity lesion within the deep periventricular white matter, differentiation diagnosis including multiple sclerosis versus small vessel disease   MRI of the cervical spine no intrinsic cord lesion, no significant degenerative disease   MRI of lumbar, moderate facet hypertrophy at L4-5, with prominent infusion in the joints bilaterally, no significant canal or foraminal narrowing   Laboratory evaluation November 2020, CMP elevated glucose 141, creatinine 0.67 negative ANA,  UPDATE Jul 23 2019: She came in today to discuss treatment option for her relapsing remitting multiple sclerosis, which has been confirmed by her history, abnormal  physical findings and abnormal MRI of thoracic, brain,   We personally reviewed MRI of thoracic spine with without contrast in April 2021, T2 hyperintensity focus in the left lateral spinal cord adjacent to T5, no contrast-enhancement   MRI of cervical spine, no contrast-enhancement, no intrinsic cord lesion   MRI of the brain only showed mild periventricular deep white matter changes, no contrast-enhancement   Reviewed laboratory evaluations,  spinal fluid testing on Jul 10, 2019 showed more than 5 oligoclonal banding, consistent diagnosis of multiple sclerosis, WBC was 1, total protein was 41 Decreased vitamin D 11, she is on vitamin D supplement, Rest of the laboratory evaluation showed normal or negative Lyme titer, ACE, ANA, copper, ESR, C-reactive protein, TSH, folic acid, [id number], HIV, RPR   She complains of gait abnormality, fatigue, lower extremity swelling, significant right hip pain   Virtual Visit via video Location: provider: [facility], patient:  home I connected with Jessica Hernandez on Nov 30 2020 by a video enabled telemedicine application and verified that I am speaking with the correct person using two  identifiers.  JC virus titer was 3.72, she received ocrelizumab since summer 2021, tolerating it well,  She is also under pain management for chronic low back pain radiating pain to left lower extremity, taking Cymbalta 60 mg in the morning/30 mg at night, gabapentin 300 mg 3 times a day, received epidural injection with minimal help in April 2022, going to receive second injection December 18, 2020  I personally reviewed MRI lumbar in May 2022: 1.   The lower spinal cord and conus medullaris appear normal. 2.   At L4-L5, there is mild disc bulging and moderate facet hypertrophy.  No nerve root compression or spinal stenosis. 3.   At L5-S1, there is a broad-based disc protrusion, mild facet hypertrophy and prominent epidural fat combining to cause mild spinal stenosis.  There is no  nerve root compression. 4.   No significant changes compared to the MRI dated 12/15/2018.  MRI of brain with and without contrast in May 2022: No contrast-enhancement, stable multiple T2/FLAIR hyperintensity in hemisphere consistent with chronic demyelinating plaque, no change compared to previous MRI in January 2021  UPDATE Jun 27 2021: She is changing job to [employer], continue to have low back pain, urinary urgency, frequency, is under the care of pain management, receiving epidural injection,  There is no significant MS flareup, intermittent upper extremity paresthesia, increased fatigue, especially at the end of ocrelizumab infusion cycle,  She has weight issues, complaints of snoring, excessive daytime fatigue, sleepiness,  Update March 30, 2022  SS:    PHYSICAL EXAMNIATION:  Gen: NAD, conversant, well nourised, well groomed                     Cardiovascular: Regular rate rhythm, no peripheral edema, warm, nontender. Eyes: Conjunctivae clear without exudates or hemorrhage Neck: Supple, no carotid bruits. Pulmonary: Clear to auscultation bilaterally   NEUROLOGICAL EXAM:  MENTAL STATUS: Speech/cognition: Awake, alert, oriented to history taking and casual conversation   CRANIAL NERVES: CN II: Visual fields are full to confrontation.  Pupils are round equal and briskly reactive to light. CN III, IV, VI: extraocular movement are normal. No ptosis. CN V: Facial sensation is intact to pinprick in all 3 divisions bilaterally. Corneal responses are intact.  CN VII: Face is symmetric  with normal eye closure and smile. CN VIII: Hearing is normal to casual conversation CN IX, X: Palate elevates symmetrically. Phonation is normal. CN XI: Head turning and shoulder shrug are intact   MOTOR: There is no pronator drift of out-stretched arms. Muscle bulk and tone are normal. Muscle strength is normal.  REFLEXES: Reflexes are 1 and symmetric at the biceps, triceps, knees, and ankles.  Plantar responses are flexor.  SENSORY: Intact to light touch, pinprick, positional and vibratory sensation are intact in fingers and toes.  COORDINATION: Rapid alternating movements and fine finger movements are intact. There is no dysmetria on finger-to-nose and heel-knee-shin.    GAIT/STANCE: She needs push-up to get up from seated position, dragging left leg, antalgic, limited by her big body habitus    REVIEW OF SYSTEMS:  Full 14 system review of systems performed and notable only for as above All other review of systems were negative.   ALLERGIES: Allergies  Allergen Reactions   Sulfamethoxazole-Trimethoprim Anaphylaxis   Clarithromycin Other (See Comments)    Other reaction(s): trouble swallowing    HOME MEDICATIONS: Current Outpatient Medications  Medication Sig Dispense Refill   acetaminophen (TYLENOL) 500 MG tablet Take 2 tablets by mouth daily as needed.      ASPIRIN 81 PO Take 81 mg by mouth daily.      celecoxib (CELEBREX) 200 MG capsule Take 1 capsule by mouth 2 (two) times daily.     Cholecalciferol (VITAMIN D3 PO) Take 2,000 Units by mouth daily.     diphenhydrAMINE (BENADRYL) 50 MG tablet Take 50 mg by mouth at bedtime as needed for itching.     DULoxetine (CYMBALTA) 30 MG capsule Take 30 mg by mouth at bedtime.     DULoxetine (CYMBALTA) 60 MG capsule Take 60 mg by mouth daily.     gabapentin (NEURONTIN) 300 MG capsule Take 300 mg by mouth 3 (three) times daily.     insulin detemir (LEVEMIR) 100 UNIT/ML injection Inject 30 Units into the skin 2 (two) times daily.      lisinopril-hydrochlorothiazide (ZESTORETIC) 10-12.5 MG tablet Take 1 tablet by mouth daily.     metFORMIN (GLUCOPHAGE) 1000 MG tablet Take 1,000 mg by mouth 2 (two) times daily with a meal.      ocrelizumab (OCREVUS) 300 MG/10ML injection Inject 600 mg into the vein. '300mg'$  IV on day 1 & 15. '600mg'$  IV q 6 months thereafter. [facility].     Semaglutide (OZEMPIC, 0.25 OR 0.5 MG/DOSE, [place]) Inject  0.25 mg into the skin once a week.     tiZANidine (ZANAFLEX) 4 MG tablet TAKE 1 TABLET (4 MG TOTAL) BY MOUTH EVERY 8 (EIGHT) HOURS AS NEEDED FOR MUSCLE SPASMS 270 tablet 3   No current facility-administered medications for this visit.    PAST MEDICAL HISTORY: Past Medical History:  Diagnosis Date   Back pain    Diabetes ([place])    Hypertension    Left leg weakness    Obesity    Right leg pain     PAST SURGICAL HISTORY: Past Surgical History:  Procedure Laterality Date   CHOLECYSTECTOMY     FOOT SURGERY     age 83    FAMILY HISTORY: Family History  Problem Relation Age of Onset   Multiple sclerosis Sister    Diabetes Mother    Thyroid cancer Mother    Hypertension Mother    Rheum arthritis Mother    Hypertension Father    Prostate cancer Father    Transient ischemic attack  Father    Healthy Brother    Diabetes Maternal Grandmother    Other Maternal Grandfather        unsure of medical history   Other Paternal Grandmother        unsure of medical history   Other Paternal Grandfather        unsure of medical history   Healthy Sister     SOCIAL HISTORY: Social History   Socioeconomic History   Marital status: Single    Spouse name: Not on file   Number of children: 1   Years of education: college   Highest education level: Not on file  Occupational History   Occupation: Therapist, sports - phone management of diseases  Tobacco Use   Smoking status: Never   Smokeless tobacco: Never  Substance and Sexual Activity   Alcohol use: Yes    Comment: occasional wine   Drug use: Never   Sexual activity: Not on file  Other Topics Concern   Not on file  Social History Narrative   Lives at home with her daughter.   Right-handed.   Caffeine use: 2 cups every other day.   Social Determinants of Health   Financial Resource Strain: Not on file  Food Insecurity: Not on file  Transportation Needs: Not on file  Physical Activity: Not on file  Stress: Not on file  Social  Connections: Not on file  Intimate Partner Violence: Not on file   Butler Denmark, Laqueta Jean, [facility] [address] [place], [place] [address] [phone]

## 2022-04-03 ENCOUNTER — Telehealth: Payer: Self-pay

## 2022-04-03 NOTE — Telephone Encounter (Signed)
Patient needs office visit and updated labs for next infusion, she is scheduled for 04/27/22. Please call

## 2022-04-03 NOTE — Telephone Encounter (Signed)
Called pt at number on chart and a gentleman picked up and stated that it was a wrong number and he did not know who the pt was.

## 2022-04-06 NOTE — Telephone Encounter (Signed)
Called pt. A gentleman answered and said I don't know who she is.

## 2022-07-03 ENCOUNTER — Telehealth: Payer: Self-pay | Admitting: Neurology

## 2022-07-03 NOTE — Telephone Encounter (Signed)
Call patient for follow up visit, ok with np

## 2022-07-05 NOTE — Telephone Encounter (Signed)
Called pt for follow up visit. VM is full and couldn't leave message.

## 2022-09-24 ENCOUNTER — Other Ambulatory Visit: Payer: Self-pay | Admitting: Neurology

## 2023-03-13 ENCOUNTER — Telehealth: Payer: Self-pay | Admitting: Neurology

## 2023-03-13 NOTE — Telephone Encounter (Signed)
 Pt called to schedule follow up appointment. Ask patient there were any new issues or any issue. Pt stated,want to schedule an appointment because been a  while since I seen Dr. Terrace Arabia.

## 2023-05-14 DIAGNOSIS — I10 Essential (primary) hypertension: Secondary | ICD-10-CM | POA: Diagnosis not present

## 2023-05-14 DIAGNOSIS — R109 Unspecified abdominal pain: Secondary | ICD-10-CM | POA: Diagnosis not present

## 2023-05-14 DIAGNOSIS — M5416 Radiculopathy, lumbar region: Secondary | ICD-10-CM | POA: Diagnosis not present

## 2023-05-14 DIAGNOSIS — E1165 Type 2 diabetes mellitus with hyperglycemia: Secondary | ICD-10-CM | POA: Diagnosis not present

## 2023-05-14 DIAGNOSIS — F320 Major depressive disorder, single episode, mild: Secondary | ICD-10-CM | POA: Diagnosis not present

## 2023-05-14 DIAGNOSIS — Z794 Long term (current) use of insulin: Secondary | ICD-10-CM | POA: Diagnosis not present

## 2023-06-11 ENCOUNTER — Telehealth: Payer: Self-pay | Admitting: Neurology

## 2023-06-11 NOTE — Telephone Encounter (Signed)
 MYC conf

## 2023-06-12 ENCOUNTER — Ambulatory Visit (INDEPENDENT_AMBULATORY_CARE_PROVIDER_SITE_OTHER): Payer: Self-pay | Admitting: Neurology

## 2023-06-12 ENCOUNTER — Encounter: Payer: Self-pay | Admitting: Neurology

## 2023-06-12 VITALS — BP 160/104

## 2023-06-12 DIAGNOSIS — G35 Multiple sclerosis: Secondary | ICD-10-CM | POA: Diagnosis not present

## 2023-06-12 DIAGNOSIS — R269 Unspecified abnormalities of gait and mobility: Secondary | ICD-10-CM | POA: Diagnosis not present

## 2023-06-12 DIAGNOSIS — F418 Other specified anxiety disorders: Secondary | ICD-10-CM | POA: Diagnosis not present

## 2023-06-12 NOTE — Progress Notes (Signed)
 ASSESSMENT AND PLAN  Jessica Hernandez is a 48 y.o. female   Relapsing remitting multiple sclerosis Chronic low back pain Gait abnormalities Depression anxiety  Diagnosis of relapsing remitting multiple sclerosis since 2015, with evidence of supratentorium and thoracic involvement,  High JCV titer 3.72 Was treated with ocrelizumab since summer 2021, but lost follow-up since August 2023 due to depression anxiety, Would like to resume her treatment, Restart ocrelizumab Or Brumvi,  Return To Clinic With NP In 6 Months    DIAGNOSTIC DATA (LABS, IMAGING, TESTING) - I reviewed patient records, labs, notes, testing and imaging myself where available.  Laboratory evaluation in May 2022 showed within normal range of IgG IgA IgM, CMP glucose 195, CBC hemoglobin of 13.1, A1c of 7.9    MEDICAL HISTORY: Jessica Hernandez is a 48 year old female, seen in request by neurosurgeon Dr. Franky Macho, and primary care physician Dr. Lindajo Royal for evaluation of gait abnormality, new onset paresthesia, and abnormal MRI scans, initial evaluation was on April 23, 2019.   I have reviewed and summarized the referring note from the referring physician.  She had a past medical history of insulin-dependent diabetes, obesity, hypertension,   Around 2015, she noticed gradual onset left leg difficulty, she has to use her hands to lift up her left leg when step into the car, rely on the handrail to go up stairs, she denies significant pain, or sensory loss, she was seen by outside neurologist, had MRI of lumbar spine with no clear etiology was found, her left leg weakness has been persistent over the years   January 2020, she began to develop low back pain, radiating pain to right lower extremity, along the right lateral thigh, anterior shin, bottom of her right foot, also complains of slow worsening urinary urgency,   Since September 2020, she noticed left hand weakness, she has difficulty opening bottles with her left hand,  denies pain, or sensory loss   She has been followed up by her optometrist on a yearly basis, there was no visual loss   Since January 2021, she noticed numbness tingling heating sensation at the anterior abdomen region, radiating aiding along left lower thoracic region,  MRIs in January 2021,   MRI of brain, multiple T2/flair hyperintensity within the deep periventricular white multiple T2/flair hyperintensity lesion within the deep periventricular white matter, differentiation diagnosis including multiple sclerosis versus small vessel disease   MRI of the cervical spine no intrinsic cord lesion, no significant degenerative disease   MRI of lumbar, moderate facet hypertrophy at L4-5, with prominent infusion in the joints bilaterally, no significant canal or foraminal narrowing   MRI of thoracic spine: T2 hyperintense focus in the left lateral spinal cord adjacent to T5. There are nonspecific, it is consistent with a chronic demyelinating plaque     spinal fluid testing on Jul 10, 2019 showed more than 5 oligoclonal banding, consistent diagnosis of multiple sclerosis, WBC was 1, total protein was 41 Decreased vitamin D 11, she is on vitamin D supplement, Rest of the laboratory evaluation showed normal or negative Lyme titer, ACE, ANA, copper, ESR, C-reactive protein, TSH, folic acid, B12, HIV, RPR    JC virus titer was 3.72,   She received ocrelizumab since summer 2021, tolerating it well,  She is also under pain management for chronic low back pain radiating pain to left lower extremity, taking Cymbalta 60 mg in the morning/30 mg at night, gabapentin 300 mg 3 times a day, received epidural injection periodically   MRI lumbar in May  2022: 1.   The lower spinal cord and conus medullaris appear normal. 2.   At L4-L5, there is mild disc bulging and moderate facet hypertrophy.  No nerve root compression or spinal stenosis. 3.   At L5-S1, there is a broad-based disc protrusion, mild facet  hypertrophy and prominent epidural fat combining to cause mild spinal stenosis.  There is no nerve root compression. 4.   No significant changes compared to the MRI dated 12/15/2018.  Most recent MRI of brain with and without contrast in May 2022: No contrast-enhancement, stable multiple T2/FLAIR hyperintensity in hemisphere consistent with chronic demyelinating plaque, no change compared to previous MRI in January 2021  Lost follow-up since 2023 due to worsening depression, stress at home, now she is on higher dose of medication including Cymbalta 90 mg daily, trazodone 100 mg at nighttime, is doing better, return to clinic" wants to do the right thing", she currently works as a Designer, jewellery at [employer], left arm and leg weakness, mild gait abnormality, urinary urgency,   PHYSICAL EXAMNIATION:    06/12/2023    9:37 AM 06/12/2023    8:45 AM 06/27/2021   10:42 AM  Vitals with BMI  Systolic 160 166 536  Diastolic 104 109 644  Pulse   90     Gen: NAD, conversant, well nourised, well groomed                     Cardiovascular: Regular rate rhythm, no peripheral edema, warm, nontender. Eyes: Conjunctivae clear without exudates or hemorrhage Neck: Supple, no carotid bruits. Pulmonary: Clear to auscultation bilaterally   NEUROLOGICAL EXAM:  MENTAL STATUS: Speech/cognition: Depressed looking middle-age female, awake, alert, oriented to history taking and casual conversation   CRANIAL NERVES: CN II: Visual fields are full to confrontation.  Pupils are round equal and briskly reactive to light. CN III, IV, VI: extraocular movement are normal. No ptosis. CN V: Facial sensation is intact to pinprick in all 3 divisions bilaterally. Corneal responses are intact.  CN VII: Face is symmetric with normal eye closure and smile. CN VIII: Hearing is normal to casual conversation CN IX, X: Palate elevates symmetrically. Phonation is normal. CN XI: Head turning and shoulder shrug are  intact  MOTOR: Fixation of left arm upon rapid rotating movement, mild drift of left leg  REFLEXES: Reflexes are 1 and symmetric at the biceps, triceps, knees, and ankles. Plantar responses are flexor.  SENSORY: Intact to light touch, pinprick, positional and vibratory sensation are intact in fingers and toes.  COORDINATION: Rapid alternating movements and fine finger movements are intact. There is no dysmetria on finger-to-nose and heel-knee-shin.    GAIT/STANCE: She needs push-up to get up from seated position, dragging left leg, antalgic, limited by her big body habitus    REVIEW OF SYSTEMS:  Full 14 system review of systems performed and notable only for as above All other review of systems were negative.   ALLERGIES: Allergies  Allergen Reactions   Sulfamethoxazole-Trimethoprim Anaphylaxis   Clarithromycin Other (See Comments)    Other reaction(s): trouble swallowing    HOME MEDICATIONS: Current Outpatient Medications  Medication Sig Dispense Refill   acetaminophen (TYLENOL) 500 MG tablet Take 2 tablets by mouth daily as needed.      amLODIPine-Valsartan-HCTZ 5-160-12.5 MG TABS Take 1 tablet by mouth daily.     ASPIRIN 81 PO Take 81 mg by mouth daily.      DULoxetine (CYMBALTA) 30 MG capsule Take 30 mg by mouth  at bedtime.     DULoxetine (CYMBALTA) 60 MG capsule Take 60 mg by mouth daily.     gabapentin (NEURONTIN) 300 MG capsule Take 300 mg by mouth 3 (three) times daily.     ocrelizumab (OCREVUS) 300 MG/10ML injection Inject 600 mg into the vein. 300mg  IV on day 1 & 15. 600mg  IV q 6 months thereafter. [facility].     RYBELSUS 7 MG TABS Take 1 tablet by mouth daily.     tiZANidine (ZANAFLEX) 4 MG tablet TAKE 1 TABLET (4 MG TOTAL) BY MOUTH EVERY 8 (EIGHT) HOURS AS NEEDED FOR MUSCLE SPASMS 270 tablet 3   traZODone (DESYREL) 100 MG tablet Take 100 mg by mouth at bedtime.     No current facility-administered medications for this visit.    PAST MEDICAL  HISTORY: Past Medical History:  Diagnosis Date   Back pain    Diabetes (HCC)    Hypertension    Left leg weakness    Obesity    Right leg pain     PAST SURGICAL HISTORY: Past Surgical History:  Procedure Laterality Date   CHOLECYSTECTOMY     FOOT SURGERY     age 55    FAMILY HISTORY: Family History  Problem Relation Age of Onset   Multiple sclerosis Sister    Diabetes Mother    Thyroid cancer Mother    Hypertension Mother    Rheum arthritis Mother    Hypertension Father    Prostate cancer Father    Transient ischemic attack Father    Healthy Brother    Diabetes Maternal Grandmother    Other Maternal Grandfather        unsure of medical history   Other Paternal Grandmother        unsure of medical history   Other Paternal Grandfather        unsure of medical history   Healthy Sister     SOCIAL HISTORY: Social History   Socioeconomic History   Marital status: Single    Spouse name: Not on file   Number of children: 1   Years of education: college   Highest education level: Not on file  Occupational History   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   Right-handed.   Caffeine use: 2 cups every other day.   Social Drivers of Corporate investment banker Strain: Not on file  Food Insecurity: Not on file  Transportation Needs: Not on file  Physical Activity: Not on file  Stress: Not on file  Social Connections: Not on file  Intimate Partner Violence: Not on file      Phebe Brasil, M.D. Ph.D.  [facility] [address] [place], [place] [address] Ph: [phone] Fax: [phone]  CC:  Rada Buerger, MD [address] [place],  [place] [address]  Rada Buerger, MD primary

## 2023-06-16 LAB — QUANTIFERON-TB GOLD PLUS
QuantiFERON Mitogen Value: >10 [IU]/mL
QuantiFERON Nil Value: 0.05 [IU]/mL
QuantiFERON TB1 Ag Value: 0.09 [IU]/mL
QuantiFERON TB2 Ag Value: 0.07 [IU]/mL
QuantiFERON-TB Gold Plus: NEGATIVE

## 2023-06-16 LAB — IGG, IGA, IGM
IgA/Immunoglobulin A, Serum: 224 mg/dL (ref 87–352)
IgG (Immunoglobin G), Serum: 1096 mg/dL (ref 586–1602)
IgM (Immunoglobulin M), Srm: 61 mg/dL (ref 26–217)

## 2023-06-16 LAB — HEPATITIS B SURFACE ANTIGEN: Hepatitis B Surface Ag: NEGATIVE

## 2023-06-16 LAB — HEPATITIS C ANTIBODY: Hep C Virus Ab: NONREACTIVE

## 2023-06-16 LAB — VITAMIN D 25 HYDROXY (VIT D DEFICIENCY, FRACTURES): Vit D, 25-Hydroxy: 18.8 ng/mL — ABNORMAL LOW (ref 30.0–100.0)

## 2023-06-16 LAB — HEPATITIS B CORE ANTIBODY, TOTAL: Hep B Core Total Ab: NEGATIVE

## 2023-06-16 LAB — TSH: TSH: 2.87 u[IU]/mL (ref 0.450–4.500)

## 2023-06-18 ENCOUNTER — Encounter: Payer: Self-pay | Admitting: Neurology

## 2023-06-27 ENCOUNTER — Encounter: Payer: Self-pay | Admitting: *Deleted

## 2023-07-09 DIAGNOSIS — Z0289 Encounter for other administrative examinations: Secondary | ICD-10-CM

## 2023-07-11 NOTE — Telephone Encounter (Signed)
 [employer] form completed and faxed

## 2023-07-19 DIAGNOSIS — G35 Multiple sclerosis: Secondary | ICD-10-CM | POA: Diagnosis not present

## 2023-08-02 ENCOUNTER — Telehealth: Payer: Self-pay

## 2023-08-02 DIAGNOSIS — G35 Multiple sclerosis: Secondary | ICD-10-CM

## 2023-08-02 NOTE — Telephone Encounter (Signed)
 Notified by Burdette Carolin in infusion suite that patient cancelled her second ocrevus infusion today due to side effects. Kim encouraged patient to reach out to us  and let us  know side effects/symptoms. I call patient and she reports 2 weeks after the infusion she was having so nerve pain in her right.She denies DVT. She said the pain has happened before on ocrevus but this time was the worst and she was out of work form the pain. She states she is willing to move forward with briumvi. Will inform Dr. Gracie Lav

## 2023-08-07 NOTE — Addendum Note (Signed)
 Addended by: Remell Giaimo on: 08/07/2023 11:07 AM   Modules accepted: Orders

## 2023-08-07 NOTE — Telephone Encounter (Signed)
 Briumvi paperwork started, documented in additional phone note.

## 2023-08-07 NOTE — Telephone Encounter (Signed)
 Call to patient, she is in agreement to come end of August for labs. [facility] aware of patient Dr. Gracie Lav plan for patient

## 2023-08-07 NOTE — Telephone Encounter (Signed)
 Patient with long history of noncompliance, lost follow-up since August 2023, restarted ocrelizumab on Jul 19, 2023, concerning infusion related reaction, with elevated blood pressure, long chair time, would like to switch to Briumvi, I see no contraindication for that,    But would like to wait for at least 3 months before starting infusion according to Briumiv protocol,   Check lab at the end of August for baseline, if labs were OK will proceed with Briumvi, complete Briuvmi paperwork today.  Orders Placed This Encounter  Procedures   IgG, IgA, IgM   CBC with Differential/Platelet   Comprehensive metabolic panel with GFR   CD20 B Cells

## 2023-09-26 ENCOUNTER — Other Ambulatory Visit (HOSPITAL_BASED_OUTPATIENT_CLINIC_OR_DEPARTMENT_OTHER): Payer: Self-pay

## 2023-09-26 DIAGNOSIS — E1165 Type 2 diabetes mellitus with hyperglycemia: Secondary | ICD-10-CM | POA: Diagnosis not present

## 2023-09-26 DIAGNOSIS — I10 Essential (primary) hypertension: Secondary | ICD-10-CM | POA: Diagnosis not present

## 2023-09-26 DIAGNOSIS — M5416 Radiculopathy, lumbar region: Secondary | ICD-10-CM | POA: Diagnosis not present

## 2023-09-26 DIAGNOSIS — Z794 Long term (current) use of insulin: Secondary | ICD-10-CM | POA: Diagnosis not present

## 2023-09-26 DIAGNOSIS — F320 Major depressive disorder, single episode, mild: Secondary | ICD-10-CM | POA: Diagnosis not present

## 2023-09-26 MED ORDER — DEXCOM G7 SENSOR MISC
6 refills | Status: AC
  Filled 2023-09-26: qty 3, 30d supply, fill #0

## 2023-09-26 MED ORDER — VALSARTAN-HYDROCHLOROTHIAZIDE 160-25 MG PO TABS
1.0000 | ORAL_TABLET | Freq: Every day | ORAL | 1 refills | Status: AC
  Filled 2023-09-26 – 2024-01-04 (×2): qty 90, 90d supply, fill #0

## 2023-09-26 MED ORDER — INSULIN GLARGINE-YFGN 100 UNIT/ML ~~LOC~~ SOPN
20.0000 [IU] | PEN_INJECTOR | Freq: Every evening | SUBCUTANEOUS | 0 refills | Status: DC
  Filled 2023-09-26: qty 18, 90d supply, fill #0

## 2023-09-26 MED ORDER — EMBECTA PEN NEEDLE ULTRAFINE 31G X 5 MM MISC
1.0000 | 0 refills | Status: AC
  Filled 2023-09-26: qty 100, 25d supply, fill #0

## 2023-09-26 MED ORDER — INSULIN LISPRO (1 UNIT DIAL) 100 UNIT/ML (KWIKPEN)
5.0000 [IU] | PEN_INJECTOR | Freq: Three times a day (TID) | SUBCUTANEOUS | 0 refills | Status: DC
  Filled 2023-09-26 – 2023-11-08 (×2): qty 15, 90d supply, fill #0

## 2023-09-26 MED ORDER — BD INSULIN SYRINGE ULTRAFINE 31G X 5/16" 1 ML MISC
5 refills | Status: AC
  Filled 2023-09-26: qty 100, 34d supply, fill #0

## 2023-09-26 MED ORDER — BD VEO INSULIN SYR ULTRAFINE 31G X 15/64" 0.5 ML MISC
1.0000 | Freq: Three times a day (TID) | 1 refills | Status: DC
  Filled 2023-09-26: qty 100, 90d supply, fill #0

## 2023-09-26 MED ORDER — DULOXETINE HCL 60 MG PO CPEP
60.0000 mg | ORAL_CAPSULE | Freq: Every day | ORAL | 1 refills | Status: AC
  Filled 2023-09-26 – 2023-11-08 (×2): qty 90, 90d supply, fill #0
  Filled 2024-02-07 – 2024-03-05 (×2): qty 90, 90d supply, fill #1

## 2023-09-26 MED ORDER — TRAZODONE HCL 100 MG PO TABS
100.0000 mg | ORAL_TABLET | Freq: Every evening | ORAL | 1 refills | Status: DC
  Filled 2023-09-26 – 2024-01-04 (×2): qty 90, 90d supply, fill #0

## 2023-09-26 MED ORDER — DULOXETINE HCL 30 MG PO CPEP
30.0000 mg | ORAL_CAPSULE | Freq: Every day | ORAL | 1 refills | Status: AC
  Filled 2023-09-26 – 2023-11-08 (×2): qty 90, 90d supply, fill #0
  Filled 2024-01-29 (×2): qty 90, 90d supply, fill #1

## 2023-09-26 MED ORDER — MOUNJARO 2.5 MG/0.5ML ~~LOC~~ SOAJ
2.5000 mg | SUBCUTANEOUS | 0 refills | Status: DC
  Filled 2023-09-26 – 2023-11-08 (×2): qty 2, 28d supply, fill #0

## 2023-09-26 MED ORDER — GABAPENTIN 300 MG PO CAPS
ORAL_CAPSULE | ORAL | 1 refills | Status: AC
  Filled 2023-09-26 – 2023-11-08 (×2): qty 360, 90d supply, fill #0
  Filled 2024-03-05: qty 360, 90d supply, fill #1

## 2023-09-27 ENCOUNTER — Other Ambulatory Visit (HOSPITAL_BASED_OUTPATIENT_CLINIC_OR_DEPARTMENT_OTHER): Payer: Self-pay

## 2023-09-27 ENCOUNTER — Other Ambulatory Visit: Payer: Self-pay

## 2023-10-10 ENCOUNTER — Other Ambulatory Visit (HOSPITAL_BASED_OUTPATIENT_CLINIC_OR_DEPARTMENT_OTHER): Payer: Self-pay

## 2023-10-31 ENCOUNTER — Other Ambulatory Visit (HOSPITAL_BASED_OUTPATIENT_CLINIC_OR_DEPARTMENT_OTHER): Payer: Self-pay

## 2023-11-08 ENCOUNTER — Other Ambulatory Visit (HOSPITAL_BASED_OUTPATIENT_CLINIC_OR_DEPARTMENT_OTHER): Payer: Self-pay

## 2023-11-08 ENCOUNTER — Other Ambulatory Visit: Payer: Self-pay

## 2023-11-19 ENCOUNTER — Telehealth: Payer: Self-pay

## 2023-11-19 NOTE — Telephone Encounter (Signed)
 Received notification from [facility]) that they need updated visit note stating wjy switch to briumvi as last note stated tolerating ocrevus  well.  Routing to Dr. Onita to advise on how to proceed or see if she can addend note

## 2023-11-20 NOTE — Telephone Encounter (Signed)
 Last visit was in April, set up a in person or virtual visit.

## 2023-11-20 NOTE — Progress Notes (Unsigned)
 ASSESSMENT AND PLAN  Jessica Hernandez is a 48 y.o. female   Relapsing remitting multiple sclerosis Chronic low back pain Gait abnormalities Depression anxiety  Diagnosis of relapsing remitting multiple sclerosis since 2015, with evidence of supratentorium and thoracic involvement,  High JCV titer 3.72 Was treated with ocrelizumab  since summer 2021, but lost follow-up since August 2023 due to depression anxiety, Would like to resume her treatment, Restart ocrelizumab  Or Brumvi,  Return To Clinic With NP In 6 Months    DIAGNOSTIC DATA (LABS, IMAGING, TESTING) - I reviewed patient records, labs, notes, testing and imaging myself where available.  Laboratory evaluation in May 2022 showed within normal range of IgG IgA IgM, CMP glucose 195, CBC hemoglobin of 13.1, A1c of 7.9    MEDICAL HISTORY: Jessica Hernandez is a 48 year old female, seen in request by neurosurgeon Dr. Gillie, and primary care physician Dr. Bettye for evaluation of gait abnormality, new onset paresthesia, and abnormal MRI scans, initial evaluation was on April 23, 2019.   I have reviewed and summarized the referring note from the referring physician.  She had a past medical history of insulin -dependent diabetes, obesity, hypertension,   Around 2015, she noticed gradual onset left leg difficulty, she has to use her hands to lift up her left leg when step into the car, rely on the handrail to go up stairs, she denies significant pain, or sensory loss, she was seen by outside neurologist, had MRI of lumbar spine with no clear etiology was found, her left leg weakness has been persistent over the years   January 2020, she began to develop low back pain, radiating pain to right lower extremity, along the right lateral thigh, anterior shin, bottom of her right foot, also complains of slow worsening urinary urgency,   Since September 2020, she noticed left hand weakness, she has difficulty opening bottles with her left hand,  denies pain, or sensory loss   She has been followed up by her optometrist on a yearly basis, there was no visual loss   Since January 2021, she noticed numbness tingling heating sensation at the anterior abdomen region, radiating aiding along left lower thoracic region,  MRIs in January 2021,   MRI of brain, multiple T2/flair hyperintensity within the deep periventricular white multiple T2/flair hyperintensity lesion within the deep periventricular white matter, differentiation diagnosis including multiple sclerosis versus small vessel disease   MRI of the cervical spine no intrinsic cord lesion, no significant degenerative disease   MRI of lumbar, moderate facet hypertrophy at L4-5, with prominent infusion in the joints bilaterally, no significant canal or foraminal narrowing   MRI of thoracic spine: T2 hyperintense focus in the left lateral spinal cord adjacent to T5. There are nonspecific, it is consistent with a chronic demyelinating plaque     spinal fluid testing on Jul 10, 2019 showed more than 5 oligoclonal banding, consistent diagnosis of multiple sclerosis, WBC was 1, total protein was 41 Decreased vitamin D  11, she is on vitamin D  supplement, Rest of the laboratory evaluation showed normal or negative Lyme titer, ACE, ANA, copper, ESR, C-reactive protein, TSH, folic acid, B12, HIV, RPR    JC virus titer was 3.72,   She received ocrelizumab  since summer 2021, tolerating it well,  She is also under pain management for chronic low back pain radiating pain to left lower extremity, taking Cymbalta  60 mg in the morning/30 mg at night, gabapentin  300 mg 3 times a day, received epidural injection periodically   MRI lumbar in May  2022: 1.   The lower spinal cord and conus medullaris appear normal. 2.   At L4-L5, there is mild disc bulging and moderate facet hypertrophy.  No nerve root compression or spinal stenosis. 3.   At L5-S1, there is a broad-based disc protrusion, mild facet  hypertrophy and prominent epidural fat combining to cause mild spinal stenosis.  There is no nerve root compression. 4.   No significant changes compared to the MRI dated 12/15/2018.  Most recent MRI of brain with and without contrast in May 2022: No contrast-enhancement, stable multiple T2/FLAIR hyperintensity in hemisphere consistent with chronic demyelinating plaque, no change compared to previous MRI in January 2021  Lost follow-up since 2023 due to worsening depression, stress at home, now she is on higher dose of medication including Cymbalta  90 mg daily, trazodone  100 mg at nighttime, is doing better, return to clinic wants to do the right thing, she currently works as a Designer, jewellery at [employer], left arm and leg weakness, mild gait abnormality, urinary urgency,  Update November 21, 2023 SS:    PHYSICAL EXAMNIATION:    06/12/2023    9:37 AM 06/12/2023    8:45 AM 06/27/2021   10:42 AM  Vitals with BMI  Systolic 160 166 840  Diastolic 104 109 891  Pulse   90     Gen: NAD, conversant, well nourised, well groomed                     Cardiovascular: Regular rate rhythm, no peripheral edema, warm, nontender. Eyes: Conjunctivae clear without exudates or hemorrhage Neck: Supple, no carotid bruits. Pulmonary: Clear to auscultation bilaterally   NEUROLOGICAL EXAM:  MENTAL STATUS: Speech/cognition: Depressed looking middle-age female, awake, alert, oriented to history taking and casual conversation   CRANIAL NERVES: CN II: Visual fields are full to confrontation.  Pupils are round equal and briskly reactive to light. CN III, IV, VI: extraocular movement are normal. No ptosis. CN V: Facial sensation is intact to pinprick in all 3 divisions bilaterally. Corneal responses are intact.  CN VII: Face is symmetric with normal eye closure and smile. CN VIII: Hearing is normal to casual conversation CN IX, X: Palate elevates symmetrically. Phonation is normal. CN XI: Head  turning and shoulder shrug are intact  MOTOR: Fixation of left arm upon rapid rotating movement, mild drift of left leg  REFLEXES: Reflexes are 1 and symmetric at the biceps, triceps, knees, and ankles. Plantar responses are flexor.  SENSORY: Intact to light touch, pinprick, positional and vibratory sensation are intact in fingers and toes.  COORDINATION: Rapid alternating movements and fine finger movements are intact. There is no dysmetria on finger-to-nose and heel-knee-shin.    GAIT/STANCE: She needs push-up to get up from seated position, dragging left leg, antalgic, limited by her big body habitus    REVIEW OF SYSTEMS:  Full 14 system review of systems performed and notable only for as above All other review of systems were negative.   ALLERGIES: Allergies  Allergen Reactions   Sulfamethoxazole-Trimethoprim Anaphylaxis   Clarithromycin Other (See Comments)    Other reaction(s): trouble swallowing    HOME MEDICATIONS: Current Outpatient Medications  Medication Sig Dispense Refill   acetaminophen (TYLENOL) 500 MG tablet Take 2 tablets by mouth daily as needed.      amLODIPine-Valsartan -HCTZ 5-160-12.5 MG TABS Take 1 tablet by mouth daily.     ASPIRIN 81 PO Take 81 mg by mouth daily.      BD INSULIN  SYRINGE  ULTRAFINE 31G X 5/16 1 ML MISC Inject 1 Units under the skin See Admin Instructions. Up to 3 times daily for insulin  SS and long acting 100 each 5   BD VEO INSULIN  SYR ULTRAFINE 31G X 15/64 0.5 ML MISC Use to inject insulin  3 (three) times daily with meals as directed 100 each 1   Continuous Glucose Sensor (DEXCOM G7 SENSOR) MISC Use 1 sensor as directed for CGM every 10 days 3 each 6   DULoxetine  (CYMBALTA ) 30 MG capsule Take 30 mg by mouth at bedtime.     DULoxetine  (CYMBALTA ) 30 MG capsule Take 1 capsule (30 mg) by mouth daily with 60 mg for total dose 90 mg 90 capsule 1   DULoxetine  (CYMBALTA ) 60 MG capsule Take 60 mg by mouth daily.     DULoxetine  (CYMBALTA ) 60  MG capsule Take 1 capsule (60 mg) by mouth daily with 30mg  for total dose 90 mg 90 capsule 1   gabapentin  (NEURONTIN ) 300 MG capsule Take 300 mg by mouth 3 (three) times daily.     gabapentin  (NEURONTIN ) 300 MG capsule Take 3 capsules (900 mg total) by mouth every morning AND 1 capsule (300 mg total) at bedtime. 360 capsule 1   insulin  glargine-yfgn (SEMGLEE ) 100 UNIT/ML Pen Inject 20 Units under the skin nightly. 18 mL 0   insulin  lispro (HUMALOG ) 100 UNIT/ML KwikPen Inject 5 Units into the skin 3 (three) times daily before meals. 15 mL 0   Insulin  Pen Needle (EMBECTA PEN NEEDLE ULTRAFINE) 31G X 5 MM MISC Use 1 pen needle as directed to inject insulin  4 (four) times daily. 100 each 0   ocrelizumab  (OCREVUS ) 300 MG/10ML injection Inject 600 mg into the vein. 300mg  IV on day 1 & 15. 600mg  IV q 6 months thereafter. [facility].     RYBELSUS 7 MG TABS Take 1 tablet by mouth daily.     tirzepatide  (MOUNJARO ) 2.5 MG/0.5ML Pen Inject 2.5 mg into the skin once a week. 2 mL 0   tiZANidine  (ZANAFLEX ) 4 MG tablet TAKE 1 TABLET (4 MG TOTAL) BY MOUTH EVERY 8 (EIGHT) HOURS AS NEEDED FOR MUSCLE SPASMS 270 tablet 3   traZODone  (DESYREL ) 100 MG tablet Take 100 mg by mouth at bedtime.     traZODone  (DESYREL ) 100 MG tablet Take 1 tablet (100 mg total) by mouth nightly. 90 tablet 1   valsartan -hydrochlorothiazide  (DIOVAN -HCT) 160-25 MG tablet Take 1 tablet by mouth daily. 90 tablet 1   No current facility-administered medications for this visit.    PAST MEDICAL HISTORY: Past Medical History:  Diagnosis Date   Back pain    Diabetes (HCC)    Hypertension    Left leg weakness    Obesity    Right leg pain     PAST SURGICAL HISTORY: Past Surgical History:  Procedure Laterality Date   CHOLECYSTECTOMY     FOOT SURGERY     age 26    FAMILY HISTORY: Family History  Problem Relation Age of Onset   Multiple sclerosis Sister    Diabetes Mother    Thyroid  cancer Mother    Hypertension Mother    Rheum  arthritis Mother    Hypertension Father    Prostate cancer Father    Transient ischemic attack Father    Healthy Brother    Diabetes Maternal Grandmother    Other Maternal Grandfather        unsure of medical history   Other Paternal Grandmother        unsure  of medical history   Other Paternal Grandfather        unsure of medical history   Healthy Sister     SOCIAL HISTORY: Social History   Socioeconomic History   Marital status: Single    Spouse name: Not on file   Number of children: 1   Years of education: college   Highest education level: Not on file  Occupational History   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   Right-handed.   Caffeine use: 2 cups every other day.   Social Drivers of Corporate investment banker Strain: Not on file  Food Insecurity: Not on file  Transportation Needs: Not on file  Physical Activity: Not on file  Stress: Not on file  Social Connections: Not on file  Intimate Partner Violence: Not on file   Lauraine Born, SCHARLENE, DNP  [facility] [address], [address] [place], [place] [address] [phone]

## 2023-11-20 NOTE — Telephone Encounter (Signed)
 Unable to leave msg 1st attempt by hf 11/20/23

## 2023-11-20 NOTE — Telephone Encounter (Signed)
 Called and spoke to pt and was able to schedule appt vv to get scheduled to see np as directed by md yan. 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

## 2023-11-21 ENCOUNTER — Telehealth (INDEPENDENT_AMBULATORY_CARE_PROVIDER_SITE_OTHER): Admitting: Neurology

## 2023-11-21 DIAGNOSIS — M545 Low back pain, unspecified: Secondary | ICD-10-CM

## 2023-11-21 DIAGNOSIS — F418 Other specified anxiety disorders: Secondary | ICD-10-CM

## 2023-11-21 DIAGNOSIS — G8929 Other chronic pain: Secondary | ICD-10-CM

## 2023-11-21 DIAGNOSIS — R269 Unspecified abnormalities of gait and mobility: Secondary | ICD-10-CM

## 2023-11-21 DIAGNOSIS — G35 Multiple sclerosis: Secondary | ICD-10-CM

## 2023-11-21 NOTE — Patient Instructions (Signed)
 Please come by the office to have blood work done.  Once we have the blood work we can submit the paperwork for Briumvi.  Check MRI of the brain with and without contrast for MS surveillance.

## 2023-11-24 NOTE — Progress Notes (Signed)
 Chart reviewed, agree above plan ?

## 2023-11-26 ENCOUNTER — Telehealth: Payer: Self-pay | Admitting: Neurology

## 2023-11-26 NOTE — Telephone Encounter (Signed)
 I tried to call twice-says call can't be completed and no VM. If she calls back her MRI can be scheduled here.

## 2023-12-03 ENCOUNTER — Other Ambulatory Visit (HOSPITAL_BASED_OUTPATIENT_CLINIC_OR_DEPARTMENT_OTHER): Payer: Self-pay

## 2023-12-03 MED ORDER — MOUNJARO 5 MG/0.5ML ~~LOC~~ SOAJ
5.0000 mg | SUBCUTANEOUS | 0 refills | Status: DC
  Filled 2023-12-03 – 2024-01-04 (×2): qty 2, 28d supply, fill #0

## 2023-12-03 MED ORDER — TIZANIDINE HCL 4 MG PO TABS
4.0000 mg | ORAL_TABLET | Freq: Four times a day (QID) | ORAL | 0 refills | Status: DC | PRN
  Filled 2023-12-03: qty 90, 23d supply, fill #0

## 2023-12-20 ENCOUNTER — Telehealth: Payer: Self-pay | Admitting: Neurology

## 2023-12-20 ENCOUNTER — Other Ambulatory Visit (HOSPITAL_BASED_OUTPATIENT_CLINIC_OR_DEPARTMENT_OTHER): Payer: Self-pay

## 2023-12-20 NOTE — Telephone Encounter (Signed)
 On 9/29 I tried to call twice to schedule her MRI-says call can't be completed and no VM and she doesn't use MyChart.

## 2023-12-26 DIAGNOSIS — G35A Relapsing-remitting multiple sclerosis: Secondary | ICD-10-CM | POA: Diagnosis not present

## 2024-01-04 ENCOUNTER — Other Ambulatory Visit (HOSPITAL_BASED_OUTPATIENT_CLINIC_OR_DEPARTMENT_OTHER): Payer: Self-pay

## 2024-01-09 DIAGNOSIS — G35A Relapsing-remitting multiple sclerosis: Secondary | ICD-10-CM | POA: Diagnosis not present

## 2024-01-16 ENCOUNTER — Other Ambulatory Visit (HOSPITAL_BASED_OUTPATIENT_CLINIC_OR_DEPARTMENT_OTHER): Payer: Self-pay

## 2024-01-16 ENCOUNTER — Other Ambulatory Visit: Payer: Self-pay

## 2024-01-16 DIAGNOSIS — E1165 Type 2 diabetes mellitus with hyperglycemia: Secondary | ICD-10-CM | POA: Diagnosis not present

## 2024-01-16 DIAGNOSIS — M5416 Radiculopathy, lumbar region: Secondary | ICD-10-CM | POA: Diagnosis not present

## 2024-01-16 DIAGNOSIS — Z794 Long term (current) use of insulin: Secondary | ICD-10-CM | POA: Diagnosis not present

## 2024-01-16 DIAGNOSIS — I10 Essential (primary) hypertension: Secondary | ICD-10-CM | POA: Diagnosis not present

## 2024-01-16 MED ORDER — INSULIN LISPRO (1 UNIT DIAL) 100 UNIT/ML (KWIKPEN)
18.0000 [IU] | PEN_INJECTOR | Freq: Three times a day (TID) | SUBCUTANEOUS | 0 refills | Status: AC
  Filled 2024-01-16: qty 60, 111d supply, fill #0
  Filled 2024-01-29 – 2024-02-02 (×3): qty 15, 28d supply, fill #0

## 2024-01-16 MED ORDER — MOUNJARO 7.5 MG/0.5ML ~~LOC~~ SOAJ
7.5000 mg | SUBCUTANEOUS | 0 refills | Status: DC
  Filled 2024-01-16 – 2024-01-28 (×3): qty 2, 28d supply, fill #0

## 2024-01-16 MED ORDER — TRAZODONE HCL 100 MG PO TABS
100.0000 mg | ORAL_TABLET | Freq: Every evening | ORAL | 1 refills | Status: AC
  Filled 2024-01-16: qty 90, 90d supply, fill #0

## 2024-01-16 MED ORDER — INSULIN GLARGINE-YFGN 100 UNIT/ML ~~LOC~~ SOPN
15.0000 [IU] | PEN_INJECTOR | Freq: Every evening | SUBCUTANEOUS | 0 refills | Status: DC
  Filled 2024-01-16: qty 6, 40d supply, fill #0

## 2024-01-16 MED ORDER — TIZANIDINE HCL 4 MG PO TABS
4.0000 mg | ORAL_TABLET | Freq: Four times a day (QID) | ORAL | 3 refills | Status: AC | PRN
  Filled 2024-01-16 – 2024-01-29 (×2): qty 30, 8d supply, fill #0
  Filled 2024-03-05: qty 30, 8d supply, fill #1

## 2024-01-17 ENCOUNTER — Other Ambulatory Visit (HOSPITAL_BASED_OUTPATIENT_CLINIC_OR_DEPARTMENT_OTHER): Payer: Self-pay

## 2024-01-21 ENCOUNTER — Other Ambulatory Visit (HOSPITAL_BASED_OUTPATIENT_CLINIC_OR_DEPARTMENT_OTHER): Payer: Self-pay

## 2024-01-28 ENCOUNTER — Other Ambulatory Visit (HOSPITAL_BASED_OUTPATIENT_CLINIC_OR_DEPARTMENT_OTHER): Payer: Self-pay

## 2024-01-29 ENCOUNTER — Other Ambulatory Visit (HOSPITAL_BASED_OUTPATIENT_CLINIC_OR_DEPARTMENT_OTHER): Payer: Self-pay

## 2024-02-02 ENCOUNTER — Other Ambulatory Visit (HOSPITAL_COMMUNITY): Payer: Self-pay

## 2024-02-04 ENCOUNTER — Other Ambulatory Visit (HOSPITAL_BASED_OUTPATIENT_CLINIC_OR_DEPARTMENT_OTHER): Payer: Self-pay

## 2024-02-05 NOTE — Progress Notes (Unsigned)
 ASSESSMENT AND PLAN  Jessica Hernandez is a 48 y.o. female   Relapsing remitting multiple sclerosis Chronic low back pain Gait abnormalities Depression anxiety  Diagnosis of relapsing remitting multiple sclerosis symptoms since 2015, imaging with evidence of supratentorium and thoracic involvement, spinal fluid testing with 5 oligoclonal bands in 2021, High JCV titer 3.72 Was treated with ocrelizumab  since summer 2021, but lost follow-up since August 2023 due to depression anxiety, restarted Ocrevus  loading dose in May 2025, but had infusion reaction, will switch to Briumvi, needs labs before paperwork can be submitted, we will check MRI brain with and without contrast Keep follow up visit with me in December  DIAGNOSTIC DATA (LABS, IMAGING, TESTING) - I reviewed patient records, labs, notes, testing and imaging myself where available.  Laboratory evaluation in May 2022 showed within normal range of IgG IgA IgM, CMP glucose 195, CBC hemoglobin of 13.1, A1c of 7.9  MEDICAL HISTORY: Jessica Hernandez is a 48 year old female, seen in request by neurosurgeon Dr. Gillie, and primary care physician Dr. Bettye for evaluation of gait abnormality, new onset paresthesia, and abnormal MRI scans, initial evaluation was on April 23, 2019.   I have reviewed and summarized the referring note from the referring physician.  She had a past medical history of insulin -dependent diabetes, obesity, hypertension,   Around 2015, she noticed gradual onset left leg difficulty, she has to use her hands to lift up her left leg when step into the car, rely on the handrail to go up stairs, she denies significant pain, or sensory loss, she was seen by outside neurologist, had MRI of lumbar spine with no clear etiology was found, her left leg weakness has been persistent over the years   January 2020, she began to develop low back pain, radiating pain to right lower extremity, along the right lateral thigh, anterior  shin, bottom of her right foot, also complains of slow worsening urinary urgency,   Since September 2020, she noticed left hand weakness, she has difficulty opening bottles with her left hand, denies pain, or sensory loss   She has been followed up by her optometrist on a yearly basis, there was no visual loss   Since January 2021, she noticed numbness tingling heating sensation at the anterior abdomen region, radiating aiding along left lower thoracic region,  MRIs in January 2021,   MRI of brain, multiple T2/flair hyperintensity within the deep periventricular white multiple T2/flair hyperintensity lesion within the deep periventricular white matter, differentiation diagnosis including multiple sclerosis versus small vessel disease   MRI of the cervical spine no intrinsic cord lesion, no significant degenerative disease   MRI of lumbar, moderate facet hypertrophy at L4-5, with prominent infusion in the joints bilaterally, no significant canal or foraminal narrowing   MRI of thoracic spine: T2 hyperintense focus in the left lateral spinal cord adjacent to T5. There are nonspecific, it is consistent with a chronic demyelinating plaque     spinal fluid testing on Jul 10, 2019 showed more than 5 oligoclonal banding, consistent diagnosis of multiple sclerosis, WBC was 1, total protein was 41 Decreased vitamin D  11, she is on vitamin D  supplement, Rest of the laboratory evaluation showed normal or negative Lyme titer, ACE, ANA, copper, ESR, C-reactive protein, TSH, folic acid, B12, HIV, RPR   JC virus titer was 3.72,   She received ocrelizumab  since summer 2021, tolerating it well,  She is also under pain management for chronic low back pain radiating pain to left lower extremity,  taking Cymbalta  60 mg in the morning/30 mg at night, gabapentin  300 mg 3 times a day, received epidural injection periodically   MRI lumbar in May 2022: 1.   The lower spinal cord and conus medullaris appear  normal. 2.   At L4-L5, there is mild disc bulging and moderate facet hypertrophy.  No nerve root compression or spinal stenosis. 3.   At L5-S1, there is a broad-based disc protrusion, mild facet hypertrophy and prominent epidural fat combining to cause mild spinal stenosis.  There is no nerve root compression. 4.   No significant changes compared to the MRI dated 12/15/2018.  Most recent MRI of brain with and without contrast in May 2022: No contrast-enhancement, stable multiple T2/FLAIR hyperintensity in hemisphere consistent with chronic demyelinating plaque, no change compared to previous MRI in January 2021  Lost follow-up since 2023 due to worsening depression, stress at home, now she is on higher dose of medication including Cymbalta  90 mg daily, trazodone  100 mg at nighttime, is doing better, return to clinic wants to do the right thing, she currently works as a designer, jewellery at [employer], left arm and leg weakness, mild gait abnormality, urinary urgency,  Update November 21, 2023 SS: Last Ocrevus  Jul 19, 2023, infusion reaction with high BP, decided to switch to Briumvi. Thinks got over a flare, had worsening weakness to the left leg, some days feels like dragging the leg, has pain to the right leg, fatigued. 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 Would like to discuss FMLA, only gets one day every 6 months, called out in June missed 5 days of work. Doesn't go to to pain management anymore, remains on Cymbalta  and gabapentin . BP has been running 140/80's.   Update February 06, 2024 SS:   PHYSICAL EXAMNIATION:    06/12/2023    9:37 AM 06/12/2023    8:45 AM 06/27/2021   10:42 AM  Vitals with BMI  Systolic 160 166 840  Diastolic 104 109 891  Pulse   90    Via video visit, patient is alert and oriented, seated for visit, with gait slight limp on the left, no assistive device  REVIEW OF SYSTEMS:  Full 14 system review of systems performed and notable only for  as above All other review of systems were negative.  ALLERGIES: Allergies  Allergen Reactions   Sulfamethoxazole-Trimethoprim Anaphylaxis   Clarithromycin Other (See Comments)    Other reaction(s): trouble swallowing    HOME MEDICATIONS: Current Outpatient Medications  Medication Sig Dispense Refill   acetaminophen (TYLENOL) 500 MG tablet Take 2 tablets by mouth daily as needed.      amLODIPine-Valsartan -HCTZ 5-160-12.5 MG TABS Take 1 tablet by mouth daily.     ASPIRIN 81 PO Take 81 mg by mouth daily.      BD INSULIN  SYRINGE ULTRAFINE 31G X 5/16 1 ML MISC Inject 1 Units under the skin See Admin Instructions. Up to 3 times daily for insulin  SS and long acting 100 each 5   BD VEO INSULIN  SYR ULTRAFINE 31G X 15/64 0.5 ML MISC Use to inject insulin  3 (three) times daily with meals as directed 100 each 1   Continuous Glucose Sensor (DEXCOM G7 SENSOR) MISC Use 1 sensor as directed for CGM every 10 days 3 each 6   DULoxetine  (CYMBALTA ) 30 MG capsule Take 30 mg by mouth at bedtime.     DULoxetine  (CYMBALTA ) 30 MG capsule Take 1 capsule (30 mg) by mouth daily with 60 mg for  total dose 90 mg 90 capsule 1   DULoxetine  (CYMBALTA ) 60 MG capsule Take 60 mg by mouth daily.     DULoxetine  (CYMBALTA ) 60 MG capsule Take 1 capsule (60 mg) by mouth daily with 30mg  for total dose 90 mg 90 capsule 1   gabapentin  (NEURONTIN ) 300 MG capsule Take 300 mg by mouth 3 (three) times daily.     gabapentin  (NEURONTIN ) 300 MG capsule Take 3 capsules (900 mg total) by mouth every morning AND 1 capsule (300 mg total) at bedtime. 360 capsule 1   insulin  glargine-yfgn (SEMGLEE ) 100 UNIT/ML Pen Inject 20 Units under the skin nightly. 18 mL 0   insulin  glargine-yfgn (SEMGLEE ) 100 UNIT/ML Pen Inject 15 Units into the skin at bedtime. 18 mL 0   insulin  lispro (HUMALOG ) 100 UNIT/ML KwikPen Inject 18 Units into the skin 3 (three) times daily before meals. 60 mL 0   Insulin  Pen Needle (EMBECTA PEN NEEDLE ULTRAFINE) 31G X 5 MM  MISC Use 1 pen needle as directed to inject insulin  4 (four) times daily. 100 each 0   ocrelizumab  (OCREVUS ) 300 MG/10ML injection Inject 600 mg into the vein. 300mg  IV on day 1 & 15. 600mg  IV q 6 months thereafter. [facility].     RYBELSUS 7 MG TABS Take 1 tablet by mouth daily.     tirzepatide  (MOUNJARO ) 7.5 MG/0.5ML Pen Inject 7.5 mg into the skin once a week. 2 mL 0   tiZANidine  (ZANAFLEX ) 4 MG tablet TAKE 1 TABLET (4 MG TOTAL) BY MOUTH EVERY 8 (EIGHT) HOURS AS NEEDED FOR MUSCLE SPASMS 270 tablet 3   tiZANidine  (ZANAFLEX ) 4 MG tablet Take 1 tablet (4 mg total) by mouth every 6 (six) hours as needed for muscle spasms. 90 tablet 0   tiZANidine  (ZANAFLEX ) 4 MG tablet Take 1 tablet (4 mg total) by mouth every 6 (six) hours as needed for muscle spasms. 30 tablet 3   traZODone  (DESYREL ) 100 MG tablet Take 100 mg by mouth at bedtime.     traZODone  (DESYREL ) 100 MG tablet Take 1 tablet (100 mg total) by mouth nightly. 90 tablet 1   traZODone  (DESYREL ) 100 MG tablet Take 1 tablet (100 mg total) by mouth at bedtime. 90 tablet 1   valsartan -hydrochlorothiazide  (DIOVAN -HCT) 160-25 MG tablet Take 1 tablet by mouth daily. 90 tablet 1   No current facility-administered medications for this visit.    PAST MEDICAL HISTORY: Past Medical History:  Diagnosis Date   Back pain    Diabetes (HCC)    Hypertension    Left leg weakness    Obesity    Right leg pain     PAST SURGICAL HISTORY: Past Surgical History:  Procedure Laterality Date   CHOLECYSTECTOMY     FOOT SURGERY     age 45    FAMILY HISTORY: Family History  Problem Relation Age of Onset   Multiple sclerosis Sister    Diabetes Mother    Thyroid  cancer Mother    Hypertension Mother    Rheum arthritis Mother    Hypertension Father    Prostate cancer Father    Transient ischemic attack Father    Healthy Brother    Diabetes Maternal Grandmother    Other Maternal Grandfather        unsure of medical history   Other Paternal  Grandmother        unsure of medical history   Other Paternal Grandfather        unsure of medical history   Healthy  Sister     SOCIAL HISTORY: Social History   Socioeconomic History   Marital status: Single    Spouse name: Not on file   Number of children: 1   Years of education: college   Highest education level: Not on file  Occupational History   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   Right-handed.   Caffeine use: 2 cups every other day.   Social Drivers of Corporate Investment Banker Strain: Not on file  Food Insecurity: Not on file  Transportation Needs: Not on file  Physical Activity: Not on file  Stress: Not on file  Social Connections: Not on file  Intimate Partner Violence: Not on file   Lauraine Born, SCHARLENE, DNP  [facility] [address] [place], [place] [address] [phone]

## 2024-02-06 ENCOUNTER — Encounter: Payer: Self-pay | Admitting: Neurology

## 2024-02-06 ENCOUNTER — Ambulatory Visit: Admitting: Neurology

## 2024-02-06 VITALS — BP 138/88 | HR 102 | Resp 14 | Ht 68.0 in | Wt 267.0 lb

## 2024-02-06 DIAGNOSIS — F418 Other specified anxiety disorders: Secondary | ICD-10-CM | POA: Diagnosis not present

## 2024-02-06 DIAGNOSIS — G35A Relapsing-remitting multiple sclerosis: Secondary | ICD-10-CM | POA: Diagnosis not present

## 2024-02-06 DIAGNOSIS — R269 Unspecified abnormalities of gait and mobility: Secondary | ICD-10-CM | POA: Diagnosis not present

## 2024-02-06 DIAGNOSIS — R29898 Other symptoms and signs involving the musculoskeletal system: Secondary | ICD-10-CM | POA: Diagnosis not present

## 2024-02-06 DIAGNOSIS — G8929 Other chronic pain: Secondary | ICD-10-CM | POA: Diagnosis not present

## 2024-02-06 DIAGNOSIS — M5441 Lumbago with sciatica, right side: Secondary | ICD-10-CM

## 2024-02-06 DIAGNOSIS — R5383 Other fatigue: Secondary | ICD-10-CM | POA: Diagnosis not present

## 2024-02-06 NOTE — Patient Instructions (Addendum)
 Great to see you today! Continue Bruimvi Check labs  Check MRI of the brain Follow-up 6 months

## 2024-02-07 ENCOUNTER — Other Ambulatory Visit (HOSPITAL_BASED_OUTPATIENT_CLINIC_OR_DEPARTMENT_OTHER): Payer: Self-pay

## 2024-02-07 ENCOUNTER — Telehealth: Payer: Self-pay | Admitting: Neurology

## 2024-02-07 ENCOUNTER — Ambulatory Visit: Payer: Self-pay | Admitting: Neurology

## 2024-02-07 MED ORDER — VITAMIN D (ERGOCALCIFEROL) 1.25 MG (50000 UNIT) PO CAPS
50000.0000 [IU] | ORAL_CAPSULE | ORAL | 0 refills | Status: AC
  Filled 2024-02-07: qty 12, 84d supply, fill #0

## 2024-02-07 MED ORDER — VITAMIN D (ERGOCALCIFEROL) 1.25 MG (50000 UNIT) PO CAPS: 50000.0000 [IU] | ORAL_CAPSULE | ORAL | 0 refills | Status: DC

## 2024-02-07 NOTE — Telephone Encounter (Signed)
 Referral to Pain Clinic  was faxed to [facility]   [facility]  Phone: [phone]

## 2024-02-07 NOTE — Telephone Encounter (Signed)
 Vitamin D  level low at 14, I will send in prescription strength vitamin D .  Meds ordered this encounter  Medications   Vitamin D , Ergocalciferol , (DRISDOL) 1.25 MG (50000 UNIT) CAPS capsule    Sig: Take 1 capsule (50,000 Units total) by mouth every 7 (seven) days.    Dispense:  12 capsule    Refill:  0

## 2024-02-07 NOTE — Telephone Encounter (Signed)
 Updated fax number [phone]

## 2024-02-07 NOTE — Telephone Encounter (Signed)
 Called [facility] and canceled the Vit D 50000 unit Rx and I sent the Rx over to [facility].

## 2024-02-07 NOTE — Addendum Note (Signed)
 Addended by: HILLIARD HEATHER CROME on: 02/07/2024 03:00 PM   Modules accepted: Orders

## 2024-02-08 LAB — COMPREHENSIVE METABOLIC PANEL WITH GFR
ALT: 16 IU/L (ref 0–32)
AST: 17 IU/L (ref 0–40)
Albumin: 4.7 g/dL (ref 3.9–4.9)
Alkaline Phosphatase: 92 IU/L (ref 41–116)
BUN/Creatinine Ratio: 21 (ref 9–23)
BUN: 14 mg/dL (ref 6–24)
Bilirubin Total: 0.4 mg/dL (ref 0.0–1.2)
CO2: 26 mmol/L (ref 20–29)
Calcium: 9.5 mg/dL (ref 8.7–10.2)
Chloride: 99 mmol/L (ref 96–106)
Creatinine, Ser: 0.66 mg/dL (ref 0.57–1.00)
Globulin, Total: 2.6 g/dL (ref 1.5–4.5)
Glucose: 119 mg/dL — ABNORMAL HIGH (ref 70–99)
Potassium: 4.2 mmol/L (ref 3.5–5.2)
Sodium: 140 mmol/L (ref 134–144)
Total Protein: 7.3 g/dL (ref 6.0–8.5)
eGFR: 107 mL/min/1.73 (ref >59)

## 2024-02-08 LAB — CD20 B CELLS
% CD19-B Cells: 0 % — ABNORMAL LOW (ref 4.6–22.1)
% CD20-B Cells: 0 % — ABNORMAL LOW (ref 5.0–22.3)

## 2024-02-08 LAB — CBC WITH DIFFERENTIAL/PLATELET
Basophils Absolute: 0.1 x10E3/uL (ref 0.0–0.2)
Basos: 1 %
EOS (ABSOLUTE): 0.1 x10E3/uL (ref 0.0–0.4)
Eos: 1 %
Hematocrit: 41.2 % (ref 34.0–46.6)
Hemoglobin: 13.1 g/dL (ref 11.1–15.9)
Immature Grans (Abs): 0 x10E3/uL (ref 0.0–0.1)
Immature Granulocytes: 0 %
Lymphocytes Absolute: 1.9 x10E3/uL (ref 0.7–3.1)
Lymphs: 30 %
MCH: 30.7 pg (ref 26.6–33.0)
MCHC: 31.8 g/dL (ref 31.5–35.7)
MCV: 97 fL (ref 79–97)
Monocytes Absolute: 0.4 x10E3/uL (ref 0.1–0.9)
Monocytes: 6 %
Neutrophils Absolute: 3.9 x10E3/uL (ref 1.4–7.0)
Neutrophils: 62 %
Platelets: 377 x10E3/uL (ref 150–450)
RBC: 4.27 x10E6/uL (ref 3.77–5.28)
RDW: 12.4 % (ref 11.7–15.4)
WBC: 6.4 x10E3/uL (ref 3.4–10.8)

## 2024-02-08 LAB — IGG, IGA, IGM
IgG (Immunoglobin G), Serum: 1234 mg/dL (ref 586–1602)
IgM (Immunoglobulin M), Srm: 52 mg/dL (ref 26–217)
Immunoglobulin A, (IgA) QN, Serum: 230 mg/dL (ref 87–352)

## 2024-02-08 LAB — VITAMIN D 25 HYDROXY (VIT D DEFICIENCY, FRACTURES): Vit D, 25-Hydroxy: 14.9 ng/mL — ABNORMAL LOW (ref 30.0–100.0)

## 2024-02-11 ENCOUNTER — Other Ambulatory Visit (HOSPITAL_BASED_OUTPATIENT_CLINIC_OR_DEPARTMENT_OTHER): Payer: Self-pay

## 2024-02-11 MED ORDER — INSULIN GLARGINE-YFGN 100 UNIT/ML ~~LOC~~ SOPN
15.0000 [IU] | PEN_INJECTOR | Freq: Every evening | SUBCUTANEOUS | 0 refills | Status: AC
  Filled 2024-02-11: qty 12, 80d supply, fill #0

## 2024-02-12 ENCOUNTER — Other Ambulatory Visit

## 2024-02-18 ENCOUNTER — Other Ambulatory Visit (HOSPITAL_BASED_OUTPATIENT_CLINIC_OR_DEPARTMENT_OTHER): Payer: Self-pay

## 2024-02-25 ENCOUNTER — Other Ambulatory Visit (HOSPITAL_BASED_OUTPATIENT_CLINIC_OR_DEPARTMENT_OTHER): Payer: Self-pay

## 2024-03-05 ENCOUNTER — Other Ambulatory Visit (HOSPITAL_BASED_OUTPATIENT_CLINIC_OR_DEPARTMENT_OTHER): Payer: Self-pay

## 2024-03-06 ENCOUNTER — Other Ambulatory Visit: Payer: Self-pay

## 2024-03-06 ENCOUNTER — Other Ambulatory Visit (HOSPITAL_BASED_OUTPATIENT_CLINIC_OR_DEPARTMENT_OTHER): Payer: Self-pay

## 2024-03-06 MED ORDER — TIRZEPATIDE 7.5 MG/0.5ML ~~LOC~~ SOAJ
7.5000 mg | SUBCUTANEOUS | 0 refills | Status: AC
  Filled 2024-03-06: qty 2, 28d supply, fill #0

## 2024-08-12 ENCOUNTER — Ambulatory Visit: Admitting: Neurology
